# Patient Record
Sex: Female | Born: 1995 | Hispanic: Yes | Marital: Single | Smoking: Never smoker
Health system: Southern US, Community
[De-identification: ages and names within clinical notes are randomized; demographics above are authoritative.]

## PROBLEM LIST (undated history)

## (undated) DIAGNOSIS — Z789 Other specified health status: Secondary | ICD-10-CM

## (undated) HISTORY — PX: NO PAST SURGERIES: SHX2092

---
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
## 2013-01-16 ENCOUNTER — Observation Stay: Payer: Self-pay | Admitting: Obstetrics and Gynecology

## 2013-01-24 ENCOUNTER — Observation Stay: Payer: Self-pay

## 2013-01-24 LAB — CBC WITH DIFFERENTIAL/PLATELET
Basophil %: 0.6 %
Eosinophil #: 0 10*3/uL (ref 0.0–0.7)
HCT: 38 % (ref 35.0–47.0)
HGB: 13 g/dL (ref 12.0–16.0)
Lymphocyte #: 1.8 10*3/uL (ref 1.0–3.6)
Lymphocyte %: 16.2 %
Monocyte %: 8.3 %
Neutrophil #: 8.4 10*3/uL — ABNORMAL HIGH (ref 1.4–6.5)
Neutrophil %: 74.6 %
Platelet: 213 10*3/uL (ref 150–440)
RBC: 4.36 10*6/uL (ref 3.80–5.20)
RDW: 14.3 % (ref 11.5–14.5)
WBC: 11.3 10*3/uL — ABNORMAL HIGH (ref 3.6–11.0)

## 2013-01-24 LAB — URINALYSIS, COMPLETE
Bacteria: NONE SEEN
Glucose,UR: NEGATIVE mg/dL (ref 0–75)
Ketone: NEGATIVE
Nitrite: NEGATIVE
Ph: 7 (ref 4.5–8.0)
Protein: NEGATIVE
RBC,UR: 1 /HPF (ref 0–5)
Specific Gravity: 1.008 (ref 1.003–1.030)
WBC UR: NONE SEEN /HPF (ref 0–5)

## 2013-01-31 ENCOUNTER — Observation Stay: Payer: Self-pay | Admitting: Obstetrics and Gynecology

## 2013-02-01 ENCOUNTER — Inpatient Hospital Stay: Payer: Self-pay | Admitting: Obstetrics & Gynecology

## 2013-02-01 LAB — CBC WITH DIFFERENTIAL/PLATELET
Basophil #: 0.1 10*3/uL (ref 0.0–0.1)
Basophil %: 0.2 %
Eosinophil #: 0 10*3/uL (ref 0.0–0.7)
Eosinophil %: 0 %
HGB: 13.5 g/dL (ref 12.0–16.0)
MCH: 29.2 pg (ref 26.0–34.0)
MCHC: 34.1 g/dL (ref 32.0–36.0)
MCV: 86 fL (ref 80–100)
Monocyte #: 1.1 x10 3/mm — ABNORMAL HIGH (ref 0.2–0.9)
Monocyte %: 4.1 %
Neutrophil %: 91.6 %
RBC: 4.61 10*6/uL (ref 3.80–5.20)

## 2013-02-02 LAB — HEMATOCRIT: HCT: 32.4 % — ABNORMAL LOW (ref 35.0–47.0)

## 2013-12-11 ENCOUNTER — Emergency Department: Payer: Self-pay | Admitting: Emergency Medicine

## 2013-12-11 LAB — URINALYSIS, COMPLETE
BACTERIA: NONE SEEN
BLOOD: NEGATIVE
Bilirubin,UR: NEGATIVE
Glucose,UR: NEGATIVE mg/dL (ref 0–75)
Hyaline Cast: 1
Ketone: NEGATIVE
Leukocyte Esterase: NEGATIVE
Nitrite: NEGATIVE
Ph: 5 (ref 4.5–8.0)
Protein: 30
Specific Gravity: 1.019 (ref 1.003–1.030)
Squamous Epithelial: <1
WBC UR: 3 /HPF (ref 0–5)

## 2013-12-11 LAB — COMPREHENSIVE METABOLIC PANEL
ALBUMIN: 4.2 g/dL (ref 3.4–5.0)
Alkaline Phosphatase: 96 U/L
Anion Gap: 3 — ABNORMAL LOW (ref 7–16)
BILIRUBIN TOTAL: 0.4 mg/dL (ref 0.2–1.0)
BUN: 9 mg/dL (ref 7–18)
CALCIUM: 8.5 mg/dL (ref 8.5–10.1)
CREATININE: 0.52 mg/dL — AB (ref 0.60–1.30)
Chloride: 106 mmol/L (ref 98–107)
Co2: 27 mmol/L (ref 21–32)
EGFR (Non-African Amer.): >60
GLUCOSE: 98 mg/dL (ref 65–99)
Osmolality: 271 (ref 275–301)
Potassium: 3.7 mmol/L (ref 3.5–5.1)
SGOT(AST): 18 U/L (ref 15–37)
SGPT (ALT): 23 U/L
Sodium: 136 mmol/L (ref 136–145)
TOTAL PROTEIN: 7.7 g/dL (ref 6.4–8.2)

## 2013-12-11 LAB — TROPONIN I: Troponin-I: <0.02 ng/mL

## 2013-12-11 LAB — CBC WITH DIFFERENTIAL/PLATELET
BASOS ABS: 0 10*3/uL (ref 0.0–0.1)
Basophil %: 0.5 %
EOS PCT: 0.7 %
Eosinophil #: 0.1 10*3/uL (ref 0.0–0.7)
HCT: 40.5 % (ref 35.0–47.0)
HGB: 13.5 g/dL (ref 12.0–16.0)
LYMPHS PCT: 18.4 %
Lymphocyte #: 1.9 10*3/uL (ref 1.0–3.6)
MCH: 29.6 pg (ref 26.0–34.0)
MCHC: 33.3 g/dL (ref 32.0–36.0)
MCV: 89 fL (ref 80–100)
MONOS PCT: 5 %
Monocyte #: 0.5 x10 3/mm (ref 0.2–0.9)
NEUTROS PCT: 75.4 %
Neutrophil #: 7.6 10*3/uL — ABNORMAL HIGH (ref 1.4–6.5)
PLATELETS: 221 10*3/uL (ref 150–440)
RBC: 4.57 10*6/uL (ref 3.80–5.20)
RDW: 13.2 % (ref 11.5–14.5)
WBC: 10.1 10*3/uL (ref 3.6–11.0)

## 2013-12-11 LAB — LIPASE, BLOOD: Lipase: 180 U/L (ref 73–393)

## 2014-08-19 NOTE — H&P (Signed)
L&D Evaluation:  History Expanded:  HPI 19 yo Hispanic female G1P0 at 6863w1d LMP derived EDC of 02/28/13 presenting with contractions over last few hours. was seen lastr night for possible labor and sent home with no change. now she is 6 cm. and ctx q 3-5 mion. GBS unknown   No LOF, no VB, +FM tdap 12/05/12 flu 01/02/13  PNC at [facility] begun in first trimester and has been otherwise uncomplicated. Received tdap 12/05/12. LABS: A POS, Rubella and varicella vaccines UTD.   Gravida 1   Term 0   PreTerm 0   Abortion 0   Living 0   Blood Type (Maternal) A positive   Group B Strep Results Maternal (Result >5wks must be treated as unknown) unknown/result > 5 weeks ago   Maternal HIV Negative   Maternal Syphilis Ab Nonreactive   Maternal Varicella Immune   Rubella Results (Maternal) immune   Maternal T-Dap Immune   EDC 28-Feb-2013   Presents with contractions, leaking fluid   Patient's Medical History No Chronic Illness   Patient's Surgical History none  (tooth extraction 07/2011)   Medications Pre Natal Vitamins   Allergies NKDA   Social History none   Family History Non-Contributory   ROS:  ROS see HPI   Exam:  Vital Signs stable   Urine Protein negative dipstick   General no apparent distress   Mental Status clear   Chest clear   Heart normal sinus rhythm, no murmur/gallop/rubs   Abdomen gravid, tender with contractions   Estimated Fetal Weight Average for gestational age   Fetal Position cephalic   Edema no edema   Reflexes 1+   Pelvic no external lesions, 6/c/2-   Mebranes Intact   FHT normal rate with no decels   FHT Description Cat1   Ucx regular   Ucx Frequency 4 min   Length of each Contraction 60 seconds   Skin dry   Lymph no lymphadenopathy   Impression:  Impression active labor   Plan:  Plan EFM/NST, admit for ptl GBS unknown status   Comments admit   Follow Up Appointment need to schedule   Electronic  Signatures: Adria DevonKlett, Taresa Montville (MD)  (Signed 24-Oct-14 21:01)  Authored: L&D Evaluation   Last Updated: 24-Oct-14 21:01 by Adria DevonKlett, Milla Wahlberg (MD)

## 2014-08-19 NOTE — H&P (Signed)
L&D Evaluation:  History Expanded:  HPI 19 yo G1P0 who presensts to HD today for pain and cramping since mon in the lower abd region., she was seen at HD and they checked her before doing a FFN. she is 4728w6d, urine shows micro of 10-15 wbc and 0-5 epi. so clean catch with 2+ proteinuria. she is having ctx th were a 7 now are a 5. got tdap 12/05/12   Gravida 1   Term 0   PreTerm 0   Abortion 0   Living 0   Blood Type (Maternal) A positive   Group B Strep Results Maternal (Result >5wks must be treated as unknown) unknown/result > 5 weeks ago   Maternal HIV Negative   Maternal Syphilis Ab Nonreactive   Maternal Varicella Immune   Rubella Results (Maternal) immune   [facility] Immun[facility]   Presents with abdominal pain, contractions   Patient's Medical History No Chronic Illness   Patient's Surgical History none   Medications Pre Natal Vitamins   Allergies NKDA   Social History none   Family History Non-Contributory   ROS:  ROS All systems were reviewed.  HEENT, CNS, GI, GU, Respiratory, CV, Renal and Musculoskeletal systems were found to be normal.   Exam:  Vital Signs stable   Urine Protein 2+   General no apparent distress   Mental Status clear   Chest clear   Heart normal sinus rhythm   Abdomen gravid, tender with contractions   Estimated Fetal Weight Small for gestational age   Edema no edema   Pelvic no external lesions, cervix FT and unchanged in the rtwo hoours she has been here.   Mebranes Intact   FHT normal rate with no decels   Ucx irregular   Skin dry   Impression:  Impression UTI   Plan:  Plan monitor contractions and for cervical change   Comments start macrobid   Follow Up Appointment need to schedule. in 1 week   Electronic Signatures: Adria DevonKlett, Minh Roanhorse (MD)  (Signed 08-Oct-14 16:14)  Authored: L&D Evaluation   Last Updated: 08-Oct-14 16:14 by Adria DevonKlett, Anden Bartolo (MD)

## 2014-08-19 NOTE — H&P (Signed)
L&D Evaluation:  History:  HPI 19 yo Hispanic female G1P0 with EDC=02/28/13 by LMP=05/24/2012 and confirmed with 21 week ultrasound, who presents to L&D at [redacted] weeks gestation with c/o onset regular contractions at 12 midnight and LOF since 0100. Denies VB, Rates contraction pain 4/10. Baby active. Recently seen in L&D 10/8 for cramping/contractions and proteinuria. Was treated for a UTI with Macrobid.  PNC at [facility] begun in first trimester and has been otherwise uncomplicated. Received tdap 12/05/12. LABS: A POS, Rubella and varicella vaccines UTD.   Presents with contractions, leaking fluid   Patient's Medical History No Chronic Illness   Patient's Surgical History none  (tooth extraction 07/2011)   Medications Pre Natal Vitamins   Allergies NKDA   Social History none   Family History Non-Contributory   ROS:  ROS see HPI   Exam:  Vital Signs stable   Urine Protein negative dipstick   General no apparent distress   Mental Status clear   Chest clear   Heart normal sinus rhythm, no murmur/gallop/rubs   Abdomen gravid, tender with contractions   Estimated Fetal Weight 5 1/2#   Fetal Position cephalic   Edema no edema   Reflexes 1+   Pelvic no external lesions, SSE: positive Nitrazine, neg pooling, neg fern. positive ROM PLUS.  +hyphae on fern slide CX:1/75-80%/0 blood on glove after exam   Mebranes Ruptured   Description blood tinged   FHT normal rate with no decels, 135-140 with accels to 160s to 170   FHT Description Cat1   Ucx regular, q2-5 min apart, palpate mild-mod   Skin dry   Impression:  Impression IUP at 35v weeks with PPROM in early labor   Plan:  Plan EFM/NST, monitor contractions and for cervical change, antibiotics for GBBS prophylaxis, Pitocin augmentation if needed. Discussed with patient and FOB risks of prematurity vs risks of nfection with continued observation.  Baby may have respiratory difficulties, hypoglycemia, hypothermia, feeding  difficulties, jaundice associated with prematurity and may need to remain in hospital after mother discharged.   Electronic Signatures: Farrel ConnersGutierrez, Jelisa Faulkton L (CNM)  (Signed 16-Oct-14 05:00)  Authored: L&D Evaluation   Last Updated: 16-Oct-14 05:00 by Trinna BalloonGutierrez, Lon Klippel L (CNM)

## 2014-08-19 NOTE — H&P (Signed)
L&D Evaluation:  History:  HPI 19 yo Hispanic female G1P0 at 1052w0d LMP derived EDC of 02/28/13 presenting with contractions since 10 AM today.  No LOF, no VB, +FM   PNC at [facility] begun in first trimester and has been otherwise uncomplicated. Received tdap 12/05/12. LABS: A POS, Rubella and varicella vaccines UTD.   Presents with contractions, leaking fluid   Patient's Medical History No Chronic Illness   Patient's Surgical History none  (tooth extraction 07/2011)   Medications Pre Natal Vitamins   Allergies NKDA   Social History none   Family History Non-Contributory   ROS:  ROS see HPI   Exam:  Vital Signs stable   Urine Protein negative dipstick   General no apparent distress   Mental Status clear   Chest clear   Heart normal sinus rhythm, no murmur/gallop/rubs   Abdomen gravid, tender with contractions   Estimated Fetal Weight Average for gestational age   Fetal Position cephalic   Edema no edema   Reflexes 1+   Pelvic no external lesions, 4/C/-2   Mebranes Intact   FHT normal rate with no decels   FHT Description Cat1   Ucx irregular   Skin dry   Impression:  Impression IUP at [redacted]w[redacted]d weeks with contraction   Plan:  Plan EFM/NST, monitor contractions and for cervical change   Comments 1) R/O PTL - patient has been effaced for the last week.  Was 3cm in L&D last week and 4cm earlier in clinic this week.  No LOF, no VB.     - recheck in 1-2hrs to see if any change  2) Fetus - category I tracing  3) Disposition - pending cervical change vs stable cervix on recheck if home rx Chief of Staffambien   Electronic Signatures for Addendum Section:  Lorrene ReidStaebler, Keyonta Barradas M (MD) (Signed Addendum 24-Oct-14 01:00)  Unchanged over 2-hrs contractions have spaced out, follow up on 02/04/13   Electronic Signatures: Lorrene ReidStaebler, Sherrel Ploch M (MD)  (Signed 24-Oct-14 01:04)  Authored: L&D Evaluation   Last Updated: 24-Oct-14 01:04 by Lorrene ReidStaebler, Clarence Dunsmore M (MD)

## 2015-05-01 LAB — HM HIV SCREENING LAB: HM HIV SCREENING: NEGATIVE

## 2016-05-01 ENCOUNTER — Encounter: Payer: Self-pay | Admitting: Emergency Medicine

## 2016-05-01 DIAGNOSIS — K1379 Other lesions of oral mucosa: Secondary | ICD-10-CM | POA: Insufficient documentation

## 2016-05-01 DIAGNOSIS — B349 Viral infection, unspecified: Secondary | ICD-10-CM | POA: Insufficient documentation

## 2016-05-01 NOTE — ED Triage Notes (Signed)
Patient with blisters to lower lip and inside mouth that started yesterday.

## 2016-05-02 ENCOUNTER — Emergency Department
Admission: EM | Admit: 2016-05-02 | Discharge: 2016-05-02 | Disposition: A | Discharge status: Home / Self Care | Payer: Self-pay | Attending: Emergency Medicine | Admitting: Emergency Medicine

## 2016-05-02 DIAGNOSIS — B349 Viral infection, unspecified: Secondary | ICD-10-CM

## 2016-05-02 DIAGNOSIS — R112 Nausea with vomiting, unspecified: Secondary | ICD-10-CM

## 2016-05-02 DIAGNOSIS — K121 Other forms of stomatitis: Secondary | ICD-10-CM

## 2016-05-02 MED ORDER — ONDANSETRON 4 MG PO TBDP
4.0000 mg | ORAL_TABLET | Freq: Once | ORAL | Status: AC
  Administered 2016-05-02: 4 mg via ORAL
  Filled 2016-05-02: qty 1

## 2016-05-02 MED ORDER — MAGIC MOUTHWASH W/LIDOCAINE: 5.0000 mL | Freq: Three times a day (TID) | ORAL | 0 refills | Status: DC | PRN

## 2016-05-02 MED ORDER — ONDANSETRON 4 MG PO TBDP: ORAL_TABLET | ORAL | 0 refills | Status: DC

## 2016-05-02 NOTE — ED Notes (Signed)
Pt with bilsters noted to mid lower lip that are yellow like. Pt with lesion noted to left lateral tongue noted. Pt states she has had emesis and "my bones ache". Moist oral mucus membranes.

## 2016-05-02 NOTE — ED Provider Notes (Signed)
[facility] Emergency Department Provider Note  ____________________________________________   First MD Initiated Contact with Patient 05/02/16 0158     (approximate)  I have reviewed the triage vital signs and the nursing notes.   HISTORY  Chief Complaint Mouth Lesions    HPI Nicole Eaton is a 21 y.o. female who denies any chronic medical history presents for evaluation of painful lesions on her tongue and her lower lip.  She states that these started yesterday at the same time she started having body aches and she feels like "my bones hurt", nasal congestion, fever, and general malaise.  She states that she started having some vomiting today and feels some persistent nausea.  She denies chest pain, shortness of breath, abdominal pain, dysuria.  Nothing makes her symptoms better and nothing makes them worse.  She describes the symptoms as severe.  She is awake, alert, oriented, and ambulatory without difficulty.   History reviewed. No pertinent past medical history.  There are no active problems to display for this patient.   History reviewed. No pertinent surgical history.  Prior to Admission medications   Medication Sig Start Date End Date Taking? Authorizing Provider  magic mouthwash w/lidocaine SOLN Take 5 mLs by mouth 3 (three) times daily as needed for mouth pain. Swish around in your mouth and spit it out. 05/02/16   Loleta Rose, MD  ondansetron (ZOFRAN ODT) 4 MG disintegrating tablet Allow 1-2 tablets to dissolve in your mouth every 8 hours as needed for nausea/vomiting 05/02/16   Loleta Rose, MD    Allergies Patient has no known allergies.  No family history on file.  Social History Social History  Substance Use Topics  . Smoking status: Never Smoker  . Smokeless tobacco: Never Used  . Alcohol use Not on file    Review of Systems Constitutional: +fever/chill, analysis, and general malaise Eyes: No visual  changes. ENT: Mild sore throat for several painful ulcerated lesions on her lower lip and her tongue. Cardiovascular: Denies chest pain. Respiratory: Denies shortness of breath. Gastrointestinal: No abdominal pain.  +N/V.  No diarrhea.  No constipation. Genitourinary: Negative for dysuria. Musculoskeletal: Negative for back pain. Skin: Negative for rash. Neurological: Negative for headaches, focal weakness or numbness.  10-point ROS otherwise negative.  ____________________________________________   PHYSICAL EXAM:  VITAL SIGNS: ED Triage Vitals  Enc Vitals Group     BP 05/01/16 2205 135/80     Pulse Rate 05/01/16 2205 (!) 102     Resp 05/01/16 2205 18     Temp 05/01/16 2205 99.2 F (37.3 C)     Temp src --      SpO2 --      Weight 05/01/16 2205 150 lb (68 kg)     Height 05/01/16 2205 5\' 2"  (1.575 m)     Head Circumference --      Peak Flow --      Pain Score 05/01/16 2206 9     Pain Loc --      Pain Edu? --      Excl. in GC? --     Constitutional: Alert and oriented. Generally well-appearing but seems to be suffering from viral symptoms Eyes: Conjunctivae are normal. PERRL. EOMI. Head: Atraumatic. Nose: +congestion/rhinnorhea. Mouth/Throat: Mucous membranes are moist.  Oropharynx mildly erythematous.  She has 2 aphthous ulcers on her lower lobe and one on the side of her tongue.  Otherwise there are no lesions in her oropharynx including no petechiae on the back of  the throat and no exudate.  Her lips are dry. Neck: No stridor.  No meningeal signs.   Cardiovascular: Borderline tachycardia, regular rhythm. Good peripheral circulation. Grossly normal heart sounds. Respiratory: Normal respiratory effort.  No retractions. Lungs CTAB. Gastrointestinal: Soft and nontender. No distention.  Musculoskeletal: No lower extremity tenderness nor edema. No gross deformities of extremities. Neurologic:  Normal speech and language. No gross focal neurologic deficits are appreciated.   Skin:  Skin is warm, dry and intact. No rash noted. Psychiatric: Mood and affect are normal. Speech and behavior are normal.  ____________________________________________   LABS (all labs ordered are listed, but only abnormal results are displayed)  Labs Reviewed - No data to display ____________________________________________  EKG  None - EKG not ordered by ED physician ____________________________________________  RADIOLOGY   No results found.  ____________________________________________   PROCEDURES  Procedure(s) performed:   Procedures   Critical Care performed: No ____________________________________________   INITIAL IMPRESSION / ASSESSMENT AND PLAN / ED COURSE  Pertinent labs & imaging results that were available during my care of the patient were reviewed by me and considered in my medical decision making (see chart for details).  The patient has essentially normal vital signs.  The heart rate was borderline tachycardic at triage after she walked in but it is normal at rest.  She has been stable throughout nearly 5 hours wait in the waiting room.  I discussed the viral symptoms and recommended plan of care which included Zofran, by mouth fluids, etc.  I discussed testing for influenza but explained that this would not change the plan of care and given that she is stable I do not think it is necessary.  She agrees with the plan.  We will give her Zofran and some fluid to make sure that she is able to tolerate oral intake and I will discharge her with Zofran ODT, Magic mouthwash prescription, and my usual and customary return precautions.   Clinical Course as of May 02 242  Mon May 02, 2016  0239 PAtient tolerated PO intake without difficulty.  States she is ready to go home.   [CF]  0239 I gave my usual and customary return precautions.   [CF]    Clinical Course User Index [CF] Loleta Roseory Shaylynn Nulty, MD    ____________________________________________  FINAL  CLINICAL IMPRESSION(S) / ED DIAGNOSES  Final diagnoses:  Mouth ulcers  Acute viral syndrome  Nausea and vomiting, intractability of vomiting not specified, unspecified vomiting type     MEDICATIONS GIVEN DURING THIS VISIT:  Medications  ondansetron (ZOFRAN-ODT) disintegrating tablet 4 mg (4 mg Oral Given 05/02/16 0212)     NEW OUTPATIENT MEDICATIONS STARTED DURING THIS VISIT:  New Prescriptions   MAGIC MOUTHWASH W/LIDOCAINE SOLN    Take 5 mLs by mouth 3 (three) times daily as needed for mouth pain. Swish around in your mouth and spit it out.   ONDANSETRON (ZOFRAN ODT) 4 MG DISINTEGRATING TABLET    Allow 1-2 tablets to dissolve in your mouth every 8 hours as needed for nausea/vomiting    Modified Medications   No medications on file    Discontinued Medications   No medications on file     Note:  This document was prepared using Dragon voice recognition software and may include unintentional dictation errors.    Loleta Roseory Kathee Tumlin, MD 05/02/16 [id number]

## 2016-05-02 NOTE — Discharge Instructions (Signed)

## 2018-04-23 LAB — HM PAP SMEAR: HM Pap smear: NEGATIVE

## 2019-04-12 NOTE — L&D Delivery Note (Signed)
Delivery Note At 5:00 AM a viable female was delivered via Vaginal, Spontaneous (Presentation: Right Occiput Anterior).  APGAR:8/9 , ; weight  .   Placenta status:intact  Spontaneous, Intact.  Cord:loose nuchal 3v   with the following complications: none .  Delayed cord clamping  Mother pushed 2 set and delivered small Female over intact perineum  Anesthesia: None Episiotomy: None Lacerations: None Suture Repair: n/a Est. Blood Loss (mL):  100 cc  Mom to postpartum.  Baby to Couplet care / Skin to Skin.  Ihor Austin Parrie Rasco 12/12/2019, 5:08 AM

## 2019-06-04 ENCOUNTER — Other Ambulatory Visit: Payer: Self-pay | Admitting: Family Medicine

## 2019-06-04 DIAGNOSIS — Z3481 Encounter for supervision of other normal pregnancy, first trimester: Secondary | ICD-10-CM

## 2019-06-10 ENCOUNTER — Other Ambulatory Visit: Payer: Self-pay

## 2019-06-10 ENCOUNTER — Other Ambulatory Visit: Payer: Self-pay | Admitting: Family Medicine

## 2019-06-10 ENCOUNTER — Ambulatory Visit
Admission: RE | Admit: 2019-06-10 | Discharge: 2019-06-10 | Disposition: A | Discharge status: Home / Self Care | Payer: BC Managed Care – PPO | Source: Ambulatory Visit | Attending: Family Medicine | Admitting: Family Medicine

## 2019-06-10 DIAGNOSIS — Z3481 Encounter for supervision of other normal pregnancy, first trimester: Secondary | ICD-10-CM

## 2019-07-31 ENCOUNTER — Other Ambulatory Visit: Payer: Self-pay | Admitting: Family Medicine

## 2019-07-31 DIAGNOSIS — Z3482 Encounter for supervision of other normal pregnancy, second trimester: Secondary | ICD-10-CM

## 2019-08-05 ENCOUNTER — Ambulatory Visit
Admission: RE | Admit: 2019-08-05 | Discharge: 2019-08-05 | Disposition: A | Discharge status: Home / Self Care | Payer: BC Managed Care – PPO | Source: Ambulatory Visit | Attending: Family Medicine | Admitting: Family Medicine

## 2019-08-05 ENCOUNTER — Other Ambulatory Visit: Payer: Self-pay

## 2019-08-05 DIAGNOSIS — Z3482 Encounter for supervision of other normal pregnancy, second trimester: Secondary | ICD-10-CM | POA: Diagnosis not present

## 2019-11-06 ENCOUNTER — Other Ambulatory Visit: Payer: Self-pay

## 2019-11-06 ENCOUNTER — Observation Stay
Admission: EM | Admit: 2019-11-06 | Discharge: 2019-11-06 | Disposition: A | Discharge status: Home / Self Care | Payer: BC Managed Care – PPO | Attending: Obstetrics and Gynecology | Admitting: Obstetrics and Gynecology

## 2019-11-06 DIAGNOSIS — Z8349 Family history of other endocrine, nutritional and metabolic diseases: Secondary | ICD-10-CM | POA: Insufficient documentation

## 2019-11-06 DIAGNOSIS — Z79899 Other long term (current) drug therapy: Secondary | ICD-10-CM | POA: Insufficient documentation

## 2019-11-06 DIAGNOSIS — Z3A36 36 weeks gestation of pregnancy: Secondary | ICD-10-CM | POA: Insufficient documentation

## 2019-11-06 DIAGNOSIS — Z841 Family history of disorders of kidney and ureter: Secondary | ICD-10-CM | POA: Insufficient documentation

## 2019-11-06 DIAGNOSIS — O26893 Other specified pregnancy related conditions, third trimester: Principal | ICD-10-CM | POA: Insufficient documentation

## 2019-11-06 DIAGNOSIS — Z8249 Family history of ischemic heart disease and other diseases of the circulatory system: Secondary | ICD-10-CM | POA: Insufficient documentation

## 2019-11-06 DIAGNOSIS — Z8489 Family history of other specified conditions: Secondary | ICD-10-CM | POA: Insufficient documentation

## 2019-11-06 DIAGNOSIS — Z820 Family history of epilepsy and other diseases of the nervous system: Secondary | ICD-10-CM | POA: Insufficient documentation

## 2019-11-06 DIAGNOSIS — R109 Unspecified abdominal pain: Secondary | ICD-10-CM | POA: Insufficient documentation

## 2019-11-06 LAB — URINALYSIS, COMPLETE (UACMP) WITH MICROSCOPIC
Bacteria, UA: NONE SEEN
Bilirubin Urine: NEGATIVE
Glucose, UA: NEGATIVE mg/dL
Hgb urine dipstick: NEGATIVE
Ketones, ur: NEGATIVE mg/dL
Leukocytes,Ua: NEGATIVE
Nitrite: NEGATIVE
Protein, ur: NEGATIVE mg/dL
Specific Gravity, Urine: 1.021 (ref 1.005–1.030)
pH: 6 (ref 5.0–8.0)

## 2019-11-06 LAB — WET PREP, GENITAL
Sperm: NONE SEEN
Trich, Wet Prep: NONE SEEN
Yeast Wet Prep HPF POC: NONE SEEN

## 2019-11-06 LAB — CHLAMYDIA/NGC RT PCR (ARMC ONLY)
Chlamydia Tr: NOT DETECTED
N gonorrhoeae: NOT DETECTED

## 2019-11-06 MED ORDER — METRONIDAZOLE 500 MG PO TABS: 500.0000 mg | ORAL_TABLET | Freq: Two times a day (BID) | ORAL | 0 refills | Status: AC

## 2019-11-06 NOTE — Discharge Summary (Signed)
RN reviewed discharge instructions with patient. Gave patient opportunity for questions. All questions answered at this time. Pt verbalized understanding. Pt discharged home. 

## 2019-11-06 NOTE — Discharge Summary (Signed)
Nicole Eaton is a 24 y.o. female. She is at [redacted]w[redacted]d gestation. Patient's last menstrual period was 03/19/2019 (approximate). Estimated Date of Delivery: 12/24/19  Prenatal care site:  [facility]- no prenatal records available.   Current pregnancy complicated by:  1. Previous preterm birth at 72wks, receiving Makena injections, next one scheduled this Friday on 7/30   Chief complaint: c/o vaginal pressure at night and pain in her lower abdomen since Sunday night Duration: constant Quality: cramping and pressure Severity:  Aggravating or alleviating conditions: has not tried any remedies Associated signs/symptoms: none Context: n/a  S: Resting comfortably. no CTX, no VB.no LOF,  Active fetal movement. Denies: HA, visual changes, SOB, or RUQ/epigastric pain  Maternal Medical History:  History reviewed. No pertinent past medical history.  History reviewed. No pertinent surgical history.  No Known Allergies  Prior to Admission medications   Medication Sig Start Date End Date Taking? Authorizing Provider  magic mouthwash w/lidocaine SOLN Take 5 mLs by mouth 3 (three) times daily as needed for mouth pain. Swish around in your mouth and spit it out. Patient not taking: Reported on 11/06/2019 05/02/16   Loleta Rose, MD  norgestimate-ethinyl estradiol (ORTHO-CYCLEN,SPRINTEC,PREVIFEM) 0.25-35 MG-MCG tablet Take 1 tablet by mouth daily. 04/23/18 04/24/19  Tessa Lerner, NP  ondansetron (ZOFRAN ODT) 4 MG disintegrating tablet Allow 1-2 tablets to dissolve in your mouth every 8 hours as needed for nausea/vomiting Patient not taking: Reported on 11/06/2019 05/02/16   Loleta Rose, MD      Social History: She  reports that she has never smoked. She has never used smokeless tobacco. She reports that she does not drink alcohol and does not use drugs.  Family History: family history includes Anemia in her brother and sister; High Cholesterol in her father; Hypertension in her  father; Kidney disease in her paternal grandfather; Lupus in her mother; Migraines in her sister.   Review of Systems: A full review of systems was performed and negative except as noted in the HPI.     O:  BP 100/82   Pulse 93   Temp 98.1 F (36.7 C) (Oral)   Resp 18   Ht 5\' 3"  (1.6 m)   Wt 70.3 kg   LMP 03/19/2019 (Approximate)   BMI 27.46 kg/m  Results for orders placed or performed during the hospital encounter of 11/06/19 (from the past 48 hour(s))  Wet prep, genital   Collection Time: 11/06/19  2:25 PM  Result Value Ref Range   Yeast Wet Prep HPF POC NONE SEEN NONE SEEN   Trich, Wet Prep NONE SEEN NONE SEEN   Clue Cells Wet Prep HPF POC PRESENT (A) NONE SEEN   WBC, Wet Prep HPF POC FEW (A) NONE SEEN   Sperm NONE SEEN   Urinalysis, Complete w Microscopic   Collection Time: 11/06/19  2:25 PM  Result Value Ref Range   Color, Urine YELLOW (A) YELLOW   APPearance HAZY (A) CLEAR   Specific Gravity, Urine 1.021 1.005 - 1.030   pH 6.0 5.0 - 8.0   Glucose, UA NEGATIVE NEGATIVE mg/dL   Hgb urine dipstick NEGATIVE NEGATIVE   Bilirubin Urine NEGATIVE NEGATIVE   Ketones, ur NEGATIVE NEGATIVE mg/dL   Protein, ur NEGATIVE NEGATIVE mg/dL   Nitrite NEGATIVE NEGATIVE   Leukocytes,Ua NEGATIVE NEGATIVE   RBC / HPF 0-5 0 - 5 RBC/hpf   WBC, UA 0-5 0 - 5 WBC/hpf   Bacteria, UA NONE SEEN NONE SEEN   Squamous Epithelial / LPF 6-10 0 -  5   Mucus PRESENT      Constitutional: NAD, AAOx3  HE/ENT: extraocular movements grossly intact, moist mucous membranes CV: RRR PULM: nl respiratory effort, CTABL     Abd: gravid, non-tender, non-distended, soft      Ext: Non-tender, Nonedematous   Psych: mood appropriate, speech normal Pelvic: SSE done: copious amount of frothy yellow/white dc noted. Cervix visually closed and long. No VB.   Fetal  monitoring: Cat I Appropriate for GA Baseline: 130 bpm Variability: moderate Accelerations: present x >2 Decelerations absent  Toco: 1 UC with mild  UI noted.     A/P: 24 y.o. [redacted]w[redacted]d here for antenatal surveillance for vaginal pressure and abdominal cramping in 3rd trimester.   Principle Diagnosis:  vaginal pressure and abdominal cramping in 3rd trimester.    Preterm labor: not present.   Fetal Wellbeing: Reassuring Cat 1 tracing with reactive NST   GC/CT pending, UA negative   wet prep c/w BV, Rx Flagyl to pharmacy  D/c home stable, precautions reviewed, follow-up as scheduled.    Randa Ngo, CNM 11/06/2019  4:26 PM

## 2019-11-06 NOTE — OB Triage Note (Signed)
Pt presents c/o vaginal pressure at night and pain in her lower abdomen since Sunday night. Last intercourse was Friday. Pt denies bleeding or LOF. Pt denies any urinary symptoms. Reports positive fetal movement. VSS. Will continue to monitor.

## 2019-12-11 ENCOUNTER — Encounter: Payer: Self-pay | Admitting: Obstetrics and Gynecology

## 2019-12-11 ENCOUNTER — Inpatient Hospital Stay
Admission: EM | Admit: 2019-12-11 | Discharge: 2019-12-13 | DRG: 807 | Disposition: A | Discharge status: Home / Self Care | Payer: Medicaid Other | Attending: Obstetrics and Gynecology | Admitting: Obstetrics and Gynecology

## 2019-12-11 ENCOUNTER — Other Ambulatory Visit: Payer: Self-pay

## 2019-12-11 DIAGNOSIS — Z20822 Contact with and (suspected) exposure to covid-19: Secondary | ICD-10-CM | POA: Diagnosis present

## 2019-12-11 DIAGNOSIS — O1404 Mild to moderate pre-eclampsia, complicating childbirth: Principal | ICD-10-CM | POA: Diagnosis present

## 2019-12-11 DIAGNOSIS — O479 False labor, unspecified: Secondary | ICD-10-CM | POA: Diagnosis present

## 2019-12-11 DIAGNOSIS — Z3A38 38 weeks gestation of pregnancy: Secondary | ICD-10-CM

## 2019-12-11 HISTORY — DX: Other specified health status: Z78.9

## 2019-12-11 NOTE — OB Triage Note (Signed)
Pt is a 24y/o G2P1 at [redacted]w[redacted]d with c/o contractions that began around 7pm and are every 5-7 min rating 10/10. Pt states +FM. Pt denies LOF. Pt states she has had some blood tinged mucous discharge.  Monitors applied and assessing. Initial FHT 140.

## 2019-12-12 ENCOUNTER — Encounter: Payer: Self-pay | Admitting: Obstetrics and Gynecology

## 2019-12-12 DIAGNOSIS — O1404 Mild to moderate pre-eclampsia, complicating childbirth: Secondary | ICD-10-CM | POA: Diagnosis present

## 2019-12-12 DIAGNOSIS — O479 False labor, unspecified: Secondary | ICD-10-CM | POA: Diagnosis present

## 2019-12-12 DIAGNOSIS — Z20822 Contact with and (suspected) exposure to covid-19: Secondary | ICD-10-CM | POA: Diagnosis present

## 2019-12-12 DIAGNOSIS — Z3A38 38 weeks gestation of pregnancy: Secondary | ICD-10-CM | POA: Diagnosis not present

## 2019-12-12 LAB — CBC
HCT: 33.9 % — ABNORMAL LOW (ref 36.0–46.0)
Hemoglobin: 11.3 g/dL — ABNORMAL LOW (ref 12.0–15.0)
MCH: 27.8 pg (ref 26.0–34.0)
MCHC: 33.3 g/dL (ref 30.0–36.0)
MCV: 83.3 fL (ref 80.0–100.0)
Platelets: 216 10*3/uL (ref 150–400)
RBC: 4.07 MIL/uL (ref 3.87–5.11)
RDW: 15.4 % (ref 11.5–15.5)
WBC: 13.4 10*3/uL — ABNORMAL HIGH (ref 4.0–10.5)
nRBC: 0 % (ref 0.0–0.2)

## 2019-12-12 LAB — GROUP B STREP BY PCR: Group B strep by PCR: NEGATIVE

## 2019-12-12 LAB — PROTEIN / CREATININE RATIO, URINE
Creatinine, Urine: 105 mg/dL
Protein Creatinine Ratio: 0.31 mg/mg{Cre} — ABNORMAL HIGH (ref 0.00–0.15)
Total Protein, Urine: 33 mg/dL

## 2019-12-12 LAB — COMPREHENSIVE METABOLIC PANEL
ALT: 11 U/L (ref 0–44)
AST: 20 U/L (ref 15–41)
Albumin: 3 g/dL — ABNORMAL LOW (ref 3.5–5.0)
Alkaline Phosphatase: 183 U/L — ABNORMAL HIGH (ref 38–126)
Anion gap: 11 (ref 5–15)
BUN: 15 mg/dL (ref 6–20)
CO2: 20 mmol/L — ABNORMAL LOW (ref 22–32)
Calcium: 8.6 mg/dL — ABNORMAL LOW (ref 8.9–10.3)
Chloride: 103 mmol/L (ref 98–111)
Creatinine, Ser: 0.64 mg/dL (ref 0.44–1.00)
GFR calc Af Amer: >60 mL/min (ref >60)
GFR calc non Af Amer: >60 mL/min (ref >60)
Glucose, Bld: 89 mg/dL (ref 70–99)
Potassium: 4.1 mmol/L (ref 3.5–5.1)
Sodium: 134 mmol/L — ABNORMAL LOW (ref 135–145)
Total Bilirubin: 0.6 mg/dL (ref 0.3–1.2)
Total Protein: 6.8 g/dL (ref 6.5–8.1)

## 2019-12-12 LAB — SARS CORONAVIRUS 2 BY RT PCR (HOSPITAL ORDER, PERFORMED IN ~~LOC~~ HOSPITAL LAB): SARS Coronavirus 2: NEGATIVE

## 2019-12-12 LAB — ABO/RH: ABO/RH(D): A POS

## 2019-12-12 MED ORDER — SODIUM CHLORIDE 0.9 % IV SOLN
5.0000 10*6.[IU] | Freq: Once | INTRAVENOUS | Status: DC
  Filled 2019-12-12: qty 5

## 2019-12-12 MED ORDER — OXYCODONE-ACETAMINOPHEN 5-325 MG PO TABS: 1.0000 | ORAL_TABLET | ORAL | Status: DC | PRN

## 2019-12-12 MED ORDER — ZOLPIDEM TARTRATE 5 MG PO TABS: 5.0000 mg | ORAL_TABLET | Freq: Every evening | ORAL | Status: DC | PRN

## 2019-12-12 MED ORDER — LACTATED RINGERS IV SOLN: 500.0000 mL | INTRAVENOUS | Status: DC | PRN

## 2019-12-12 MED ORDER — OXYTOCIN BOLUS FROM INFUSION
333.0000 mL | Freq: Once | INTRAVENOUS | Status: DC
  Administered 2019-12-12: 333 mL via INTRAVENOUS

## 2019-12-12 MED ORDER — DIPHENHYDRAMINE HCL 25 MG PO CAPS: 25.0000 mg | ORAL_CAPSULE | Freq: Four times a day (QID) | ORAL | Status: DC | PRN

## 2019-12-12 MED ORDER — IBUPROFEN 600 MG PO TABS
600.0000 mg | ORAL_TABLET | Freq: Four times a day (QID) | ORAL | Status: DC
  Administered 2019-12-12 – 2019-12-13 (×4): 600 mg via ORAL
  Filled 2019-12-12 (×4): qty 1

## 2019-12-12 MED ORDER — MEASLES, MUMPS & RUBELLA VAC IJ SOLR
0.5000 mL | Freq: Once | INTRAMUSCULAR | Status: DC
  Filled 2019-12-12: qty 0.5

## 2019-12-12 MED ORDER — BENZOCAINE-MENTHOL 20-0.5 % EX AERO: 1.0000 "application " | INHALATION_SPRAY | CUTANEOUS | Status: DC | PRN

## 2019-12-12 MED ORDER — ONDANSETRON HCL 4 MG/2ML IJ SOLN: 4.0000 mg | INTRAMUSCULAR | Status: DC | PRN

## 2019-12-12 MED ORDER — AMMONIA AROMATIC IN INHA
RESPIRATORY_TRACT | Status: AC
  Filled 2019-12-12: qty 10

## 2019-12-12 MED ORDER — MAGNESIUM HYDROXIDE 400 MG/5ML PO SUSP: 30.0000 mL | ORAL | Status: DC | PRN

## 2019-12-12 MED ORDER — BUTORPHANOL TARTRATE 1 MG/ML IJ SOLN: 1.0000 mg | INTRAMUSCULAR | Status: DC | PRN

## 2019-12-12 MED ORDER — ACETAMINOPHEN 325 MG PO TABS: 650.0000 mg | ORAL_TABLET | ORAL | Status: DC | PRN

## 2019-12-12 MED ORDER — SOD CITRATE-CITRIC ACID 500-334 MG/5ML PO SOLN: 30.0000 mL | ORAL | Status: DC | PRN

## 2019-12-12 MED ORDER — SENNOSIDES-DOCUSATE SODIUM 8.6-50 MG PO TABS
2.0000 | ORAL_TABLET | ORAL | Status: DC
  Administered 2019-12-12: 2 via ORAL
  Filled 2019-12-12: qty 2

## 2019-12-12 MED ORDER — COCONUT OIL OIL: 1.0000 "application " | TOPICAL_OIL | Status: DC | PRN

## 2019-12-12 MED ORDER — OXYTOCIN 10 UNIT/ML IJ SOLN
INTRAMUSCULAR | Status: AC
  Filled 2019-12-12: qty 2

## 2019-12-12 MED ORDER — LACTATED RINGERS IV SOLN: INTRAVENOUS | Status: DC

## 2019-12-12 MED ORDER — LIDOCAINE HCL (PF) 1 % IJ SOLN
INTRAMUSCULAR | Status: AC
  Filled 2019-12-12: qty 30

## 2019-12-12 MED ORDER — ONDANSETRON HCL 4 MG/2ML IJ SOLN: 4.0000 mg | Freq: Four times a day (QID) | INTRAMUSCULAR | Status: DC | PRN

## 2019-12-12 MED ORDER — MISOPROSTOL 200 MCG PO TABS
ORAL_TABLET | ORAL | Status: AC
  Filled 2019-12-12: qty 4

## 2019-12-12 MED ORDER — FERROUS SULFATE 325 (65 FE) MG PO TABS
325.0000 mg | ORAL_TABLET | Freq: Two times a day (BID) | ORAL | Status: DC
  Administered 2019-12-12 – 2019-12-13 (×3): 325 mg via ORAL
  Filled 2019-12-12 (×3): qty 1

## 2019-12-12 MED ORDER — BUTORPHANOL TARTRATE 1 MG/ML IJ SOLN
INTRAMUSCULAR | Status: AC
  Administered 2019-12-12: 1 mg via INTRAVENOUS
  Filled 2019-12-12: qty 1

## 2019-12-12 MED ORDER — OXYCODONE-ACETAMINOPHEN 5-325 MG PO TABS: 2.0000 | ORAL_TABLET | ORAL | Status: DC | PRN

## 2019-12-12 MED ORDER — ONDANSETRON HCL 4 MG PO TABS: 4.0000 mg | ORAL_TABLET | ORAL | Status: DC | PRN

## 2019-12-12 MED ORDER — PRENATAL MULTIVITAMIN CH
1.0000 | ORAL_TABLET | Freq: Every day | ORAL | Status: DC
  Administered 2019-12-12: 1 via ORAL
  Filled 2019-12-12: qty 1

## 2019-12-12 MED ORDER — LIDOCAINE HCL (PF) 1 % IJ SOLN: 30.0000 mL | INTRAMUSCULAR | Status: DC | PRN

## 2019-12-12 MED ORDER — SIMETHICONE 80 MG PO CHEW: 80.0000 mg | CHEWABLE_TABLET | ORAL | Status: DC | PRN

## 2019-12-12 MED ORDER — BUTORPHANOL TARTRATE 1 MG/ML IJ SOLN: 1.0000 mg | INTRAMUSCULAR | Status: DC

## 2019-12-12 MED ORDER — PENICILLIN G POT IN DEXTROSE 60000 UNIT/ML IV SOLN: 3.0000 10*6.[IU] | INTRAVENOUS | Status: DC

## 2019-12-12 MED ORDER — OXYTOCIN-SODIUM CHLORIDE 30-0.9 UT/500ML-% IV SOLN
2.5000 [IU]/h | INTRAVENOUS | Status: DC
  Administered 2019-12-12: 2.5 [IU]/h via INTRAVENOUS
  Filled 2019-12-12: qty 500

## 2019-12-12 MED ORDER — WITCH HAZEL-GLYCERIN EX PADS: 1.0000 "application " | MEDICATED_PAD | CUTANEOUS | Status: DC | PRN

## 2019-12-12 MED ORDER — HYDROCODONE-ACETAMINOPHEN 5-325 MG PO TABS: 1.0000 | ORAL_TABLET | ORAL | Status: DC | PRN

## 2019-12-12 MED ORDER — DIBUCAINE (PERIANAL) 1 % EX OINT: 1.0000 "application " | TOPICAL_OINTMENT | CUTANEOUS | Status: DC | PRN

## 2019-12-12 NOTE — H&P (Signed)
Nicole Eaton is a 24 y.o. female presenting for uterine ctx . BP elevated mild range on admission . + proteinuria P/Cr 0.31. Mild h/a for 3 weeks . No vision change . Prior h/o PTD and she has been on mykena. GBS status unknown  She denies any medical problems  OB History    Gravida  2   Para  1   Term      Preterm  1   AB      Living  1     SAB      TAB      Ectopic      Multiple      Live Births             Past Medical History:  Diagnosis Date  . Medical history non-contributory    Past Surgical History:  Procedure Laterality Date  . NO PAST SURGERIES     Family History: family history includes Anemia in her brother and sister; High Cholesterol in her father; Hypertension in her father; Kidney disease in her paternal grandfather; Lupus in her mother; Migraines in her sister. Social History:  reports that she has never smoked. She has never used smokeless tobacco. She reports that she does not drink alcohol and does not use drugs.     Maternal Diabetes: No Genetic Screening: no record Maternal Ultrasounds/Referrals: Normal Fetal Ultrasounds or other Referrals:  None Maternal Substance Abuse:  No Significant Maternal Medications:  None Significant Maternal Lab Results:  Other:  unknown GBSOther Comments:  None  Review of Systems History Dilation: 5 Effacement (%): 90 Station: -3 Exam by:: Nicole Gervase MD  Exam by Nicole Eaton 5 cm / c/-1  Blood pressure (!) 149/97, pulse 72, temperature 98.6 F (37 C), temperature source Oral, resp. rate 16, height 5\' 3"  (1.6 m), weight 84.4 kg, last menstrual period 03/19/2019. Exam Physical Exam  Lungs cta cv RRR FHR: intermittent late decels and deep variable , AROM : fse and IUPC placed . FHR 135 Mod variability and + accels . Mild variable decels( time 0417)   Prenatal labs: ABO, Rh:  A+ Antibody:  neg Rubella:  Imm / Varicella IMM RPR:   NR HBsAg:  neg  HIV:   neg GBS:   unknown    Assessment/Plan: Mild preeclampsia . BP not in severe range . If BP rises to severe of she develops worsening CNS sx I will start MAgnesium for sz prophylaxis . Currently reassuring fetal monitoring. If variable decels persist / worsen add amnioinfusion  Start PCN for unknown GBS status  Stadol IV for pain management. Declines CLE currently 14/11/2018 Nicole Eaton 12/12/2019, 4:07 AM

## 2019-12-12 NOTE — Lactation Note (Signed)
This note was copied from a baby's chart. Lactation Consultation Note  Patient Name: Nicole Eaton [id number] Date: 12/12/2019 Reason for consult: Follow-up assessment;Early term 37-38.6wks;Infant < 6lbs;Other (Comment) (pre-feed BS 38)  1hr post feed glucose was 38, RN notified Neo who instructed for baby to receive 30mL of 24cal formula followed by 1hr post feed repeat glucose.  LC assisted with first supplement feed educating mom on paced bottle feeding, positioning of infant and pauses and burping.  Feeding took place over 20 minutes with consumption of only 76mL. Baby placed skin to skin with mom. Feeding plan overnight: put baby to breast/wake to feed minimum every 3hrs, provide 76mL 24cal formula supplement, and pump post feedings.  Maternal Data Formula Feeding for Exclusion: No Has patient been taught Hand Expression?: Yes Does the patient have breastfeeding experience prior to this delivery?: Yes  Feeding Feeding Type: Bottle Fed - Formula  LATCH Score Latch: Grasps breast easily, tongue down, lips flanged, rhythmical sucking.  Audible Swallowing: A few with stimulation (breast compressions/massage)  Type of Nipple: Everted at rest and after stimulation  Comfort (Breast/Nipple): Soft / non-tender  Hold (Positioning): No assistance needed to correctly position infant at breast.  LATCH Score: 9  Interventions Interventions: Breast feeding basics reviewed;Hand express;Hand pump (Observation of feeding)  Lactation Tools Discussed/Used Tools: Pump;58F feeding tube / Syringe (syringe) Breast pump type: Double-Electric Breast Pump   Consult Status Consult Status: Follow-up Follow-up type: In-patient    Danford Bad 12/12/2019, 4:55 PM

## 2019-12-12 NOTE — Lactation Note (Signed)
This note was copied from a baby's chart. Lactation Consultation Note  Patient Name: Nicole Eaton [id number] Date: 12/12/2019 Reason for consult: Initial assessment;Early term 37-38.6wks;Infant < 6lbs  Initial lactation visit. Baby born at [redacted]w[redacted]d via SVD weight <6lbs. Initial BS was 41, second at 51, needing one more pre-feed before discontinuing BS protocol. Mom reports deep latch achieved on left breast, but needs assistance with left breast. LC did do nipple rolling and hand expression (colostrum easily expressed) to help bring out nipple, after a few attempts baby latched and sustained latch with LC support of breast tissue. With LC support baby did sustain latch and displayed a strong rhythmic sucking pattern with swallows intermittent identifiable by deeper chin movement. Baby remained at the breast and active for 12 minutes, coming off the breast on his own and asleep/content. Encouraged mom to keep baby skin to skin on chest following feed to help also with stabilization of blood sugars. LC assisted with getting mom comfortable, and bringing food to tray.  LC also spoke with mom about beginning a pumping routine to assist with milk removal, building of supply, and use as supplement if needed; mom in agreement.  Updated RN on feeding and plan to initiate pumping.   Maternal Data Formula Feeding for Exclusion: No Has patient been taught Hand Expression?: Yes Does the patient have breastfeeding experience prior to this delivery?: Yes  Feeding Feeding Type: Breast Fed  LATCH Score Latch: Repeated attempts needed to sustain latch, nipple held in mouth throughout feeding, stimulation needed to elicit sucking reflex.  Audible Swallowing: A few with stimulation (with compression)  Type of Nipple: Everted at rest and after stimulation (required nipple rolling)  Comfort (Breast/Nipple): Soft / non-tender  Hold (Positioning): Assistance needed to correctly position infant at  breast and maintain latch.  LATCH Score: 7  Interventions Interventions: Breast feeding basics reviewed;Assisted with latch;Hand express;Skin to skin;Breast massage;Breast compression;Adjust position;Support pillows  Lactation Tools Discussed/Used     Consult Status Consult Status: Follow-up Date: 12/12/19 Follow-up type: In-patient    Danford Bad 12/12/2019, 10:52 AM

## 2019-12-12 NOTE — Discharge Summary (Signed)
Obstetrical Discharge Summary  Patient Name: Nicole Eaton DOB: Oct 27, 1995 MRN: [id number]  Date of Admission: 12/11/2019 Date of Delivery: 12/12/19 Delivered by: Beverly Gust MD Date of Discharge: 12/13/2019  Primary OB: Phineas Real OZD:[id number] last menstrual period was 03/19/2019 (approximate). EDC Estimated Date of Delivery: 12/24/19 Gestational Age at Delivery: [redacted]w[redacted]d   Antepartum complications:elevated BP  Admitting Diagnosis: labor , Mild preeclampsia  Secondary Diagnosis: Patient Active Problem List   Diagnosis Date Noted  . Labor and delivery, indication for care 12/12/2019  . Uterine contractions during pregnancy 12/12/2019  . Abdominal pain during pregnancy, third trimester 11/06/2019    Augmentation: AROM Complications: None  Intrapartum complications/course: Presented to L&D with contractions.  Mild range blood pressures with report of headache noted during triage visit.  Admitted for labor and preeclampsia without severe features.  See delivery note for further details.   Date of Delivery:  Delivered By: Beverly Gust MD Delivery Type: spontaneous vaginal delivery Anesthesia: none Placenta: spontaneous, loose nuchal Laceration: none Episiotomy: none Newborn Data:female APGARS 8/9 weight ; 5lbs 10oz, 2560g   Postpartum Procedures: none   Edinburgh:  Edinburgh Postnatal Depression Scale Screening Tool 12/12/2019  I have been able to laugh and see the funny side of things. 0  I have looked forward with enjoyment to things. 0  I have blamed myself unnecessarily when things went wrong. 0  I have been anxious or worried for no good reason. 0  I have felt scared or panicky for no good reason. 0  Things have been getting on top of me. 0  I have been so unhappy that I have had difficulty sleeping. 0  I have felt sad or miserable. 0  I have been so unhappy that I have been crying. 0  The thought of harming myself has occurred to me. 0  Edinburgh  Postnatal Depression Scale Total 0    Post partum course:  Patient had an uncomplicated postpartum course.  By time of discharge on PPD#1, her pain was controlled on oral pain medications; she had appropriate lochia and was ambulating, voiding without difficulty and tolerating regular diet.  She was deemed stable for discharge to home.    Discharge Physical Exam:  BP 122/90 (BP Location: Right Arm)   Pulse 88   Temp 98.4 F (36.9 C) (Oral)   Resp 18   Ht 5\' 3"  (1.6 m)   Wt 84.4 kg   LMP 03/19/2019 (Approximate)   SpO2 100%   Breastfeeding Unknown   BMI 32.95 kg/m   Temp:  [98.1 F (36.7 C)-98.4 F (36.9 C)] 98.4 F (36.9 C) (09/03 0846) Pulse Rate:  [78-98] 88 (09/03 0846) Resp:  [18-20] 18 (09/03 0846) BP: (115-125)/(68-90) 122/90 (09/03 0846) SpO2:  [99 %-100 %] 100 % (09/03 0846)  General: NAD CV: RRR Pulm: CTABL, nl effort ABD: s/nd/nt, fundus firm and below the umbilicus Lochia: moderate Incision: c/d/i DVT Evaluation: LE non-ttp, no evidence of DVT on exam.  Hemoglobin  Date Value Ref Range Status  12/13/2019 9.0 (L) 12.0 - 15.0 g/dL Final   HGB  Date Value Ref Range Status  12/11/2013 13.5 12.0 - 16.0 g/dL Final   HCT  Date Value Ref Range Status  12/13/2019 26.8 (L) 36 - 46 % Final  12/11/2013 40.5 35.0 - 47.0 % Final     Disposition: stable, discharge to home. Baby Feeding: breastmilk and formula Baby Disposition: home with mom  Rh Immune globulin given: Rh pos  Rubella vaccine given: Immune  Tdap vaccine given  in AP or PP setting:  Flu vaccine given in AP or PP setting:   Contraception: TBD  Prenatal Labs:   ABO, Rh:  A+ Antibody:  neg Rubella:  Imm / Varicella IMM RPR:   NR HBsAg:  neg  HIV:   neg GBS:   negative   Plan:  Aiesha Jahzaria Vary was discharged to home in good condition. Follow-up appointment with delivering provider in 1 week for blood pressure check and  6 weeks for postpartum visit.  Discharge  Medications: Allergies as of 12/13/2019   No Known Allergies     Medication List    TAKE these medications   acetaminophen 325 MG tablet Commonly known as: Tylenol Take 2 tablets (650 mg total) by mouth every 6 (six) hours as needed for mild pain or moderate pain (for pain scale < 4).   ferrous sulfate 325 (65 FE) MG tablet Take 1 tablet (325 mg total) by mouth 2 (two) times daily with a meal.   ibuprofen 600 MG tablet Commonly known as: ADVIL Take 1 tablet (600 mg total) by mouth every 6 (six) hours as needed for mild pain or moderate pain.   prenatal multivitamin Tabs tablet Take 1 tablet by mouth daily at 12 noon.        Follow-up Information    [facility] Follow up in 1 week(s).   Why: blood pressure check Contact information: [address] [place] [address] [phone]       Schermerhorn, Ihor Austin, MD. Schedule an appointment as soon as possible for a visit in 6 week(s).   Specialty: Obstetrics and Gynecology Why: postpartum visit Contact information: [address] [place] [address] [phone]               Signed:  Margaretmary Eddy, CNM Certified Nurse Midwife [facility]

## 2019-12-12 NOTE — Lactation Note (Signed)
This note was copied from a baby's chart. Lactation Consultation Note  Patient Name: Nicole Eaton [id number] Date: 12/12/2019 Reason for consult: Follow-up assessment;Early term 37-38.6wks;Infant < 6lbs;Other (Comment) (pre-feed BS 38)  Lactation follow-up after pre-feed BS was 38. BS protocol indicates 1hr post feed BS test, and 2 consecutive BS test >40.  Baby already at breast when [facility] entered; cradle hold, left breast. Baby had strong rhythmic sucking pattern, audible and visual swallows intermittently. LC pulled down slightly on chin to flange bottom lip, and encouraged mom breast compressions and massage to assist with moving colostrum easier for baby.  Encouraged to keep baby at breast as long as active, tips given to keep baby active, and mom instructed to pump post feeding for additional stimulation and milk removal. Baby fed for 15 minutes, remains alert, but content lying with mom. LC syringe/finger fed 61ml of EBM post feeding at the breast. LC changed stool diaper, and assisted mom with set-up of pump.  Plan will be 1hr post feed glucose test followed by 2 more pre-feed tests. Mom encouraged to be with baby skin to skin as long and as much as possible between feedings, aiming to feed minimum every 3hrs at this time.   Maternal Data Formula Feeding for Exclusion: No Has patient been taught Hand Expression?: Yes Does the patient have breastfeeding experience prior to this delivery?: Yes  Feeding Feeding Type: Breast Fed  LATCH Score Latch: Grasps breast easily, tongue down, lips flanged, rhythmical sucking.  Audible Swallowing: A few with stimulation (breast compressions/massage)  Type of Nipple: Everted at rest and after stimulation  Comfort (Breast/Nipple): Soft / non-tender  Hold (Positioning): No assistance needed to correctly position infant at breast.  LATCH Score: 9  Interventions Interventions: Breast feeding basics reviewed;Hand express;Hand pump  (Observation of feeding)  Lactation Tools Discussed/Used Tools: Pump Breast pump type: Double-Electric Breast Pump Pump Review: Setup, frequency, and cleaning;Milk Storage Initiated by:: Magnus Ivan, MPH, IBCLC Date initiated:: 12/12/19   Consult Status Consult Status: Follow-up Follow-up type: In-patient    Danford Bad 12/12/2019, 2:35 PM

## 2019-12-12 NOTE — Lactation Note (Signed)
This note was copied from a baby's chart. Lactation Consultation Note  Patient Name: Nicole Eaton [id number] Date: 12/12/2019 Reason for consult: Follow-up assessment;Early term 37-38.6wks;Infant < 6lbs;Other (Comment) (watching BS)  Lactation back in room to set-up DEBP for additional stimulation and milk removal due to baby's size for gestational age and watching of BS levels. Size 68mm flanges fit appropriately, mom educated on pump set-up, use, cleaning, and giving of any expressed milk to baby.  LC educated on changes in suction as appropriate for mom, and encouraged to pump post breastfeeding times.  Mom verbalizes understanding.  Maternal Data Formula Feeding for Exclusion: No Has patient been taught Hand Expression?: Yes Does the patient have breastfeeding experience prior to this delivery?: Yes  Feeding Feeding Type: Breast Fed  LATCH Score Latch: Repeated attempts needed to sustain latch, nipple held in mouth throughout feeding, stimulation needed to elicit sucking reflex.  Audible Swallowing: A few with stimulation (with compression)  Type of Nipple: Everted at rest and after stimulation (required nipple rolling)  Comfort (Breast/Nipple): Soft / non-tender  Hold (Positioning): Assistance needed to correctly position infant at breast and maintain latch.  LATCH Score: 7  Interventions Interventions: Breast feeding basics reviewed;DEBP;Expressed milk  Lactation Tools Discussed/Used Tools: Pump Breast pump type: Double-Electric Breast Pump Pump Review: Setup, frequency, and cleaning;Milk Storage Initiated by:: Magnus Ivan, MPH, IBCLC Date initiated:: 12/12/19   Consult Status Consult Status: Follow-up Date: 12/12/19 Follow-up type: In-patient    Danford Bad 12/12/2019, 12:18 PM

## 2019-12-13 LAB — CBC
HCT: 26.8 % — ABNORMAL LOW (ref 36.0–46.0)
Hemoglobin: 9 g/dL — ABNORMAL LOW (ref 12.0–15.0)
MCH: 27.3 pg (ref 26.0–34.0)
MCHC: 33.6 g/dL (ref 30.0–36.0)
MCV: 81.2 fL (ref 80.0–100.0)
Platelets: 200 10*3/uL (ref 150–400)
RBC: 3.3 MIL/uL — ABNORMAL LOW (ref 3.87–5.11)
RDW: 15.7 % — ABNORMAL HIGH (ref 11.5–15.5)
WBC: 12 10*3/uL — ABNORMAL HIGH (ref 4.0–10.5)
nRBC: 0.2 % (ref 0.0–0.2)

## 2019-12-13 MED ORDER — PRENATAL MULTIVITAMIN CH: 1.0000 | ORAL_TABLET | Freq: Every day | ORAL | Status: DC

## 2019-12-13 MED ORDER — IBUPROFEN 600 MG PO TABS: 600.0000 mg | ORAL_TABLET | Freq: Four times a day (QID) | ORAL | 1 refills | Status: DC | PRN

## 2019-12-13 MED ORDER — FERROUS SULFATE 325 (65 FE) MG PO TABS: 325.0000 mg | ORAL_TABLET | Freq: Two times a day (BID) | ORAL | 3 refills | Status: DC

## 2019-12-13 MED ORDER — ACETAMINOPHEN 325 MG PO TABS: 650.0000 mg | ORAL_TABLET | Freq: Four times a day (QID) | ORAL | Status: AC | PRN

## 2019-12-13 NOTE — Discharge Instructions (Signed)
Breast Pumping Tips Breast pumping is a way to get milk out of your breasts. You will then store the milk for your baby to use when you are away from home. There are three ways to pump. You can:  Use your hand to massage and squeeze your breast (hand expression).  Use a hand-held machine to manually pump your milk.  Use an electric machine to pump your milk. In the beginning you may not get much milk. After a few days your breasts should make more. Pumping can help you start making milk after your baby is born. Pumping helps you to keep making milk when you are away from your baby. When should I pump? You can start pumping soon after your baby is born. Follow these tips:  When you are with your baby: ? Pump after you breastfeed. ? Pump from the free breast while you breastfeed.  When you are away from your baby: ? Pump every 2-3 hours for 15 minutes. ? Pump both breasts at the same time if you can.  If your baby drinks formula, pump around the time your baby gets the formula.  If you drank alcohol, wait 2 hours before you pump.  If you are going to have surgery, ask your doctor when you should pump again. How do I get ready to pump? Take steps to relax. Try these things to help your milk come in:  Smell your baby's blanket or clothes.  Look at a picture or video of your baby.  Sit in a quiet, private space.  Massage your breast and nipple.  Place a cloth on your breast. The cloth should be warm and a little wet.  Play relaxing music.  Picture your milk flowing. What are some tips? General tips for pumping breast milk   Always wash your hands before pumping.  If you do not get much milk or if pumping hurts, try different pump settings or a different kind of pump.  Drink enough fluid so your pee (urine) is clear or pale yellow.  Wear clothing that opens in the front or is easy to take off.  Pump milk into a clean bottle or container.  Do not use anything that has  nicotine or tobacco. Examples are cigarettes and e-cigarettes. If you need help quitting, ask your doctor. Tips for storing breast milk   Store breast milk in a clean, BPA-free container. These include: ? A glass or plastic bottle. ? A milk storage bag.  Store only 2-4 ounces of breast milk in each container.  Swirl the breast milk in the container. Do not shake it.  Write down the date you pumped the milk on the container.  This is how long you can store breast milk: ? Room temperature: 6-8 hours. It is best to use the milk within 4 hours. ? Cooler with ice packs: 24 hours. ? Refrigerator: 5-8 days, if the milk is clean. It is best to use the milk within 3 days. ? Freezer: 9-12 months, if the milk is clean and stored away from the freezer door. It is best to use the milk within 6 months.  Put milk in the back of the refrigerator or freezer.  Thaw frozen milk using warm water. Do not use the microwave. Tips for choosing a breast pump When choosing a pump, keep the following things in mind:  Manual breast pumps do not need electricity. They cost less. They can be hard to use.  Electric breast pumps use electricity. They  are more expensive. They are easier to use. They collect more milk.  The suction cup (flange) should be the right size.  Before you buy the pump, check if your insurance will pay for it. Tips for caring for a breast pump  Check the manual that came with your pump for cleaning tips.  Clean the pump after you use it. To do this: ? Wipe down the electrical part. Use a dry cloth or paper towel. Do not put this part in water or in cleaning products. ? Wash the plastic parts with soap and warm water. Or use the dishwasher if the manual says it is safe. You do not need to clean the tubing unless it touched breast milk. ? Let all the parts air dry. Avoid drying them with a cloth or towel. ? When the parts are clean and dry, put the pump back together. Then store the  pump.  If there is water in the tubing when you want to pump: 1. Attach the tubing to the pump. 2. Turn on the pump. 3. Turn off the pump when the tube is dry.  Try not to touch the inside of pump parts. Summary  Pumping can help you start making milk after your baby is born. It lets you keep making milk when you are away from your baby.  When you are away from your baby, pump for about 15 minutes every 2-3 hours. Pump both breasts at the same time, if you can. This information is not intended to replace advice given to you by your health care provider. Make sure you discuss any questions you have with your health care provider. Document Revised: 07/18/2018 Document Reviewed: 05/02/2016 Elsevier Patient Education  2020 [employer]. Breastfeeding  Choosing to breastfeed is one of the best decisions you can make for yourself and your baby. A change in hormones during pregnancy causes your breasts to make breast milk in your milk-producing glands. Hormones prevent breast milk from being released before your baby is born. They also prompt milk flow after birth. Once breastfeeding has begun, thoughts of your baby, as well as his or her sucking or crying, can stimulate the release of milk from your milk-producing glands. Benefits of breastfeeding Research shows that breastfeeding offers many health benefits for infants and mothers. It also offers a cost-free and convenient way to feed your baby. For your baby  Your first milk (colostrum) helps your baby's digestive system to function better.  Special cells in your milk (antibodies) help your baby to fight off infections.  Breastfed babies are less likely to develop asthma, allergies, obesity, or type 2 diabetes. They are also at lower risk for sudden infant death syndrome (SIDS).  Nutrients in breast milk are better able to meet your baby's needs compared to infant formula.  Breast milk improves your baby's brain development. For  you  Breastfeeding helps to create a very special bond between you and your baby.  Breastfeeding is convenient. Breast milk costs nothing and is always available at the correct temperature.  Breastfeeding helps to burn calories. It helps you to lose the weight that you gained during pregnancy.  Breastfeeding makes your uterus return faster to its size before pregnancy. It also slows bleeding (lochia) after you give birth.  Breastfeeding helps to lower your risk of developing type 2 diabetes, osteoporosis, rheumatoid arthritis, cardiovascular disease, and breast, ovarian, uterine, and endometrial cancer later in life. Breastfeeding basics Starting breastfeeding  Find a comfortable place to sit or lie  down, with your neck and back well-supported.  Place a pillow or a rolled-up blanket under your baby to bring him or her to the level of your breast (if you are seated). Nursing pillows are specially designed to help support your arms and your baby while you breastfeed.  Make sure that your baby's tummy (abdomen) is facing your abdomen.  Gently massage your breast. With your fingertips, massage from the outer edges of your breast inward toward the nipple. This encourages milk flow. If your milk flows slowly, you may need to continue this action during the feeding.  Support your breast with 4 fingers underneath and your thumb above your nipple (make the letter "C" with your hand). Make sure your fingers are well away from your nipple and your baby's mouth.  Stroke your baby's lips gently with your finger or nipple.  When your baby's mouth is open wide enough, quickly bring your baby to your breast, placing your entire nipple and as much of the areola as possible into your baby's mouth. The areola is the colored area around your nipple. ? More areola should be visible above your baby's upper lip than below the lower lip. ? Your baby's lips should be opened and extended outward (flanged) to  ensure an adequate, comfortable latch. ? Your baby's tongue should be between his or her lower gum and your breast.  Make sure that your baby's mouth is correctly positioned around your nipple (latched). Your baby's lips should create a seal on your breast and be turned out (everted).  It is common for your baby to suck about 2-3 minutes in order to start the flow of breast milk. Latching Teaching your baby how to latch onto your breast properly is very important. An improper latch can cause nipple pain, decreased milk supply, and poor weight gain in your baby. Also, if your baby is not latched onto your nipple properly, he or she may swallow some air during feeding. This can make your baby fussy. Burping your baby when you switch breasts during the feeding can help to get rid of the air. However, teaching your baby to latch on properly is still the best way to prevent fussiness from swallowing air while breastfeeding. Signs that your baby has successfully latched onto your nipple  Silent tugging or silent sucking, without causing you pain. Infant's lips should be extended outward (flanged).  Swallowing heard between every 3-4 sucks once your milk has started to flow (after your let-down milk reflex occurs).  Muscle movement above and in front of his or her ears while sucking. Signs that your baby has not successfully latched onto your nipple  Sucking sounds or smacking sounds from your baby while breastfeeding.  Nipple pain. If you think your baby has not latched on correctly, slip your finger into the corner of your baby's mouth to break the suction and place it between your baby's gums. Attempt to start breastfeeding again. Signs of successful breastfeeding Signs from your baby  Your baby will gradually decrease the number of sucks or will completely stop sucking.  Your baby will fall asleep.  Your baby's body will relax.  Your baby will retain a small amount of milk in his or her  mouth.  Your baby will let go of your breast by himself or herself. Signs from you  Breasts that have increased in firmness, weight, and size 1-3 hours after feeding.  Breasts that are softer immediately after breastfeeding.  Increased milk volume, as well as  a change in milk consistency and color by the fifth day of breastfeeding.  Nipples that are not sore, cracked, or bleeding. Signs that your baby is getting enough milk  Wetting at least 1-2 diapers during the first 24 hours after birth.  Wetting at least 5-6 diapers every 24 hours for the first week after birth. The urine should be clear or pale yellow by the age of 5 days.  Wetting 6-8 diapers every 24 hours as your baby continues to grow and develop.  At least 3 stools in a 24-hour period by the age of 5 days. The stool should be soft and yellow.  At least 3 stools in a 24-hour period by the age of 7 days. The stool should be seedy and yellow.  No loss of weight greater than 10% of birth weight during the first 3 days of life.  Average weight gain of 4-7 oz (113-198 g) per week after the age of 4 days.  Consistent daily weight gain by the age of 5 days, without weight loss after the age of 2 weeks. After a feeding, your baby may spit up a small amount of milk. This is normal. Breastfeeding frequency and duration Frequent feeding will help you make more milk and can prevent sore nipples and extremely full breasts (breast engorgement). Breastfeed when you feel the need to reduce the fullness of your breasts or when your baby shows signs of hunger. This is called "breastfeeding on demand." Signs that your baby is hungry include:  Increased alertness, activity, or restlessness.  Movement of the head from side to side.  Opening of the mouth when the corner of the mouth or cheek is stroked (rooting).  Increased sucking sounds, smacking lips, cooing, sighing, or squeaking.  Hand-to-mouth movements and sucking on fingers or  hands.  Fussing or crying. Avoid introducing a pacifier to your baby in the first 4-6 weeks after your baby is born. After this time, you may choose to use a pacifier. Research has shown that pacifier use during the first year of a baby's life decreases the risk of sudden infant death syndrome (SIDS). Allow your baby to feed on each breast as long as he or she wants. When your baby unlatches or falls asleep while feeding from the first breast, offer the second breast. Because newborns are often sleepy in the first few weeks of life, you may need to awaken your baby to get him or her to feed. Breastfeeding times will vary from baby to baby. However, the following rules can serve as a guide to help you make sure that your baby is properly fed:  Newborns (babies 39 weeks of age or younger) may breastfeed every 1-3 hours.  Newborns should not go without breastfeeding for longer than 3 hours during the day or 5 hours during the night.  You should breastfeed your baby a minimum of 8 times in a 24-hour period. Breast milk pumping     Pumping and storing breast milk allows you to make sure that your baby is exclusively fed your breast milk, even at times when you are unable to breastfeed. This is especially important if you go back to work while you are still breastfeeding, or if you are not able to be present during feedings. Your lactation consultant can help you find a method of pumping that works best for you and give you guidelines about how long it is safe to store breast milk. Caring for your breasts while you breastfeed Nipples can  become dry, cracked, and sore while breastfeeding. The following recommendations can help keep your breasts moisturized and healthy:  Avoid using soap on your nipples.  Wear a supportive bra designed especially for nursing. Avoid wearing underwire-style bras or extremely tight bras (sports bras).  Air-dry your nipples for 3-4 minutes after each feeding.  Use only  cotton bra pads to absorb leaked breast milk. Leaking of breast milk between feedings is normal.  Use lanolin on your nipples after breastfeeding. Lanolin helps to maintain your skin's normal moisture barrier. Pure lanolin is not harmful (not toxic) to your baby. You may also hand express a few drops of breast milk and gently massage that milk into your nipples and allow the milk to air-dry. In the first few weeks after giving birth, some women experience breast engorgement. Engorgement can make your breasts feel heavy, warm, and tender to the touch. Engorgement peaks within 3-5 days after you give birth. The following recommendations can help to ease engorgement:  Completely empty your breasts while breastfeeding or pumping. You may want to start by applying warm, moist heat (in the shower or with warm, water-soaked hand towels) just before feeding or pumping. This increases circulation and helps the milk flow. If your baby does not completely empty your breasts while breastfeeding, pump any extra milk after he or she is finished.  Apply ice packs to your breasts immediately after breastfeeding or pumping, unless this is too uncomfortable for you. To do this: ? Put ice in a plastic bag. ? Place a towel between your skin and the bag. ? Leave the ice on for 20 minutes, 2-3 times a day.  Make sure that your baby is latched on and positioned properly while breastfeeding. If engorgement persists after 48 hours of following these recommendations, contact your health care provider or a Advertising copywriter. Overall health care recommendations while breastfeeding  Eat 3 healthy meals and 3 snacks every day. Well-nourished mothers who are breastfeeding need an additional 450-500 calories a day. You can meet this requirement by increasing the amount of a balanced diet that you eat.  Drink enough water to keep your urine pale yellow or clear.  Rest often, relax, and continue to take your prenatal vitamins  to prevent fatigue, stress, and low vitamin and mineral levels in your body (nutrient deficiencies).  Do not use any products that contain nicotine or tobacco, such as cigarettes and e-cigarettes. Your baby may be harmed by chemicals from cigarettes that pass into breast milk and exposure to secondhand smoke. If you need help quitting, ask your health care provider.  Avoid alcohol.  Do not use illegal drugs or marijuana.  Talk with your health care provider before taking any medicines. These include over-the-counter and prescription medicines as well as vitamins and herbal supplements. Some medicines that may be harmful to your baby can pass through breast milk.  It is possible to become pregnant while breastfeeding. If birth control is desired, ask your health care provider about options that will be safe while breastfeeding your baby. Where to find more information: [employer]: www.llli.org Contact a health care provider if:  You feel like you want to stop breastfeeding or have become frustrated with breastfeeding.  Your nipples are cracked or bleeding.  Your breasts are red, tender, or warm.  You have: ? Painful breasts or nipples. ? A swollen area on either breast. ? A fever or chills. ? Nausea or vomiting. ? Drainage other than breast milk from your nipples.  Your breasts do not become full before feedings by the fifth day after you give birth.  You feel sad and depressed.  Your baby is: ? Too sleepy to eat well. ? Having trouble sleeping. ? More than 78 week old and wetting fewer than 6 diapers in a 24-hour period. ? Not gaining weight by 4 days of age.  Your baby has fewer than 3 stools in a 24-hour period.  Your baby's skin or the white parts of his or her eyes become yellow. Get help right away if:  Your baby is overly tired (lethargic) and does not want to wake up and feed.  Your baby develops an unexplained fever. Summary  Breastfeeding  offers many health benefits for infant and mothers.  Try to breastfeed your infant when he or she shows early signs of hunger.  Gently tickle or stroke your baby's lips with your finger or nipple to allow the baby to open his or her mouth. Bring the baby to your breast. Make sure that much of the areola is in your baby's mouth. Offer one side and burp the baby before you offer the other side.  Talk with your health care provider or lactation consultant if you have questions or you face problems as you breastfeed. This information is not intended to replace advice given to you by your health care provider. Make sure you discuss any questions you have with your health care provider. Document Revised: 06/22/2017 Document Reviewed: 04/29/2016 Elsevier Patient Education  2020 [employer]. Postpartum Baby Blues The postpartum period begins right after the birth of a baby. During this time, there is often a lot of joy and excitement. It is also a time of many changes in the life of the parents. No matter how many times a mother gives birth, each child brings new challenges to the family, including different ways of relating to one another. It is common to have feelings of excitement along with confusing changes in moods, emotions, and thoughts. You may feel happy one minute and sad or stressed the next. These feelings of sadness usually happen in the period right after you have your baby, and they go away within a week or two. This is called the "baby blues." What are the causes? There is no known cause of baby blues. It is likely caused by a combination of factors. However, changes in hormone levels after childbirth are believed to trigger some of the symptoms. Other factors that can play a role in these mood changes include:  Lack of sleep.  Stressful life events, such as poverty, caring for a loved one, or death of a loved one.  Genetics. What are the signs or symptoms? Symptoms of this  condition include:  Brief changes in mood, such as going from extreme happiness to sadness.  Decreased concentration.  Difficulty sleeping.  Crying spells and tearfulness.  Loss of appetite.  Irritability.  Anxiety. If the symptoms of baby blues last for more than 2 weeks or become more severe, you may have postpartum depression. How is this diagnosed? This condition is diagnosed based on an evaluation of your symptoms. There are no medical or lab tests that lead to a diagnosis, but there are various questionnaires that a health care provider may use to identify women with the baby blues or postpartum depression. How is this treated? Treatment is not needed for this condition. The baby blues usually go away on their own in 1-2 weeks. Social support is often all that is  needed. You will be encouraged to get adequate sleep and rest. Follow these instructions at home: Lifestyle      Get as much rest as you can. Take a nap when the baby sleeps.  Exercise regularly as told by your health care provider. Some women find yoga and walking to be helpful.  Eat a balanced and nourishing diet. This includes plenty of fruits and vegetables, whole grains, and lean proteins.  Do little things that you enjoy. Have a cup of tea, take a bubble bath, read your favorite magazine, or listen to your favorite music.  Avoid alcohol.  Ask for help with household chores, cooking, grocery shopping, or running errands. Do not try to do everything yourself. Consider hiring a postpartum doula to help. This is a professional who specializes in providing support to new mothers.  Try not to make any major life changes during pregnancy or right after giving birth. This can add stress. General instructions  Talk to people close to you about how you are feeling. Get support from your partner, family members, friends, or other new moms. You may want to join a support group.  Find ways to cope with stress. This  may include: ? Writing your thoughts and feelings in a journal. ? Spending time outside. ? Spending time with people who make you laugh.  Try to stay positive in how you think. Think about the things you are grateful for.  Take over-the-counter and prescription medicines only as told by your health care provider.  Let your health care provider know if you have any concerns.  Keep all postpartum visits as told by your health care provider. This is important. Contact a health care provider if:  Your baby blues do not go away after 2 weeks. Get help right away if:  You have thoughts of taking your own life (suicidal thoughts).  You think you may harm the baby or other people.  You see or hear things that are not there (hallucinations). Summary  After giving birth, you may feel happy one minute and sad or stressed the next. Feelings of sadness that happen right after the baby is born and go away after a week or two are called the "baby blues."  You can manage the baby blues by getting enough rest, eating a healthy diet, exercising, spending time with supportive people, and finding ways to cope with stress.  If feelings of sadness and stress last longer than 2 weeks or get in the way of caring for your baby, talk to your health care provider. This may mean you have postpartum depression. This information is not intended to replace advice given to you by your health care provider. Make sure you discuss any questions you have with your health care provider. Document Revised: 07/20/2018 Document Reviewed: 05/24/2016 Elsevier Patient Education  2020 [employer]  Vaginal Delivery, Care After Refer to this sheet in the next few weeks. These discharge instructions provide you with information on caring for yourself after delivery. Your caregiver may also give you specific instructions. Your treatment has been planned according to the most current medical practices available, but problems  sometimes occur. Call your caregiver if you have any problems or questions after you go home. HOME CARE INSTRUCTIONS 9. Take over-the-counter or prescription medicines only as directed by your caregiver or pharmacist. 10. Do not drink alcohol, especially if you are breastfeeding or taking medicine to relieve pain. 11. Do not smoke tobacco. 12. Continue to use good perineal care.  Good perineal care includes: 1. Wiping your perineum from back to front 2. Keeping your perineum clean. 3. You can do sitz baths twice a day, to help keep this area clean 13. Do not use tampons, douche or have sex until your caregiver says it is okay. 14. Shower only and avoid sitting in submerged water, aside from sitz baths 15. Wear a well-fitting bra that provides breast support. 16. Eat healthy foods. 17. Drink enough fluids to keep your urine clear or pale yellow. 18. Eat high-fiber foods such as whole grain cereals and breads, brown rice, beans, and fresh fruits and vegetables every day. These foods may help prevent or relieve constipation. 19. Avoid constipation with high fiber foods or medications, such as miralax or metamucil 20. Follow your caregiver's recommendations regarding resumption of activities such as climbing stairs, driving, lifting, exercising, or traveling. 21. Talk to your caregiver about resuming sexual activities. Resumption of sexual activities is dependent upon your risk of infection, your rate of healing, and your comfort and desire to resume sexual activity. 22. Try to have someone help you with your household activities and your newborn for at least a few days after you leave the hospital. 23. Rest as much as possible. Try to rest or take a nap when your newborn is sleeping. 24. Increase your activities gradually. 25. Keep all of your scheduled postpartum appointments. It is very important to keep your scheduled follow-up appointments. At these appointments, your caregiver will be checking  to make sure that you are healing physically and emotionally. SEEK MEDICAL CARE IF:   You are passing large clots from your vagina. Save any clots to show your caregiver.  You have a foul smelling discharge from your vagina.  You have trouble urinating.  You are urinating frequently.  You have pain when you urinate.  You have a change in your bowel movements.  You have increasing redness, pain, or swelling near your vaginal incision (episiotomy) or vaginal tear.  You have pus draining from your episiotomy or vaginal tear.  Your episiotomy or vaginal tear is separating.  You have painful, hard, or reddened breasts.  You have a severe headache.  You have blurred vision or see spots.  You feel sad or depressed.  You have thoughts of hurting yourself or your newborn.  You have questions about your care, the care of your newborn, or medicines.  You are dizzy or light-headed.  You have a rash.  You have nausea or vomiting.  You were breastfeeding and have not had a menstrual period within 12 weeks after you stopped breastfeeding.  You are not breastfeeding and have not had a menstrual period by the 12th week after delivery.  You have a fever. SEEK IMMEDIATE MEDICAL CARE IF:   You have persistent pain.  You have chest pain.  You have shortness of breath.  You faint.  You have leg pain.  You have stomach pain.  Your vaginal bleeding saturates two or more sanitary pads in 1 hour. MAKE SURE YOU:   Understand these instructions.  Will watch your condition.  Will get help right away if you are not doing well or get worse. Document Released: 03/25/2000 Document Revised: 08/12/2013 Document Reviewed: 11/23/2011 [facility] Patient Information 2015 [place], [place]. This information is not intended to replace advice given to you by your health care provider. Make sure you discuss any questions you have with your health care provider.  Sitz Bath A sitz bath is a warm  water bath taken  in the sitting position. The water covers only the hips and butt (buttocks). We recommend using one that fits in the toilet, to help with ease of use and cleanliness. It may be used for either healing or cleaning purposes. Sitz baths are also used to relieve pain, itching, or muscle tightening (spasms). The water may contain medicine. Moist heat will help you heal and relax.  HOME CARE  Take 3 to 4 sitz baths a day. 26. Fill the bathtub half-full with warm water. 27. Sit in the water and open the drain a little. 28. Turn on the warm water to keep the tub half-full. Keep the water running constantly. 29. Soak in the water for 15 to 20 minutes. 30. After the sitz bath, pat the affected area dry. GET HELP RIGHT AWAY IF: You get worse instead of better. Stop the sitz baths if you get worse. MAKE SURE YOU:  Understand these instructions.  Will watch your condition.  Will get help right away if you are not doing well or get worse. Document Released: 05/05/2004 Document Revised: 12/21/2011 Document Reviewed: 07/26/2010 [facility] Patient Information 2015 [place]. This information is not intended to replace advice given to you by your health care provider. Make sure you discuss any questions you have with your health care provider.

## 2019-12-13 NOTE — Progress Notes (Signed)
Patient discharged with infant. Discharge instructions, prescriptions, and follow up appointments given to and reviewed with patient. Patient verbalized understanding. Escorted out by axillary.  

## 2019-12-15 LAB — TYPE AND SCREEN
ABO/RH(D): A POS
Antibody Screen: NEGATIVE

## 2021-08-09 ENCOUNTER — Ambulatory Visit (LOCAL_COMMUNITY_HEALTH_CENTER): Payer: Self-pay | Admitting: Advanced Practice Midwife

## 2021-08-09 ENCOUNTER — Encounter: Payer: Self-pay | Admitting: Advanced Practice Midwife

## 2021-08-09 VITALS — BP 123/81 | HR 90 | Temp 98.0°F | Ht 62.0 in | Wt 168.6 lb

## 2021-08-09 DIAGNOSIS — E669 Obesity, unspecified: Secondary | ICD-10-CM | POA: Insufficient documentation

## 2021-08-09 DIAGNOSIS — Z3202 Encounter for pregnancy test, result negative: Secondary | ICD-10-CM

## 2021-08-09 DIAGNOSIS — O169 Unspecified maternal hypertension, unspecified trimester: Secondary | ICD-10-CM

## 2021-08-09 DIAGNOSIS — Z30011 Encounter for initial prescription of contraceptive pills: Secondary | ICD-10-CM

## 2021-08-09 DIAGNOSIS — Z3009 Encounter for other general counseling and advice on contraception: Secondary | ICD-10-CM

## 2021-08-09 HISTORY — DX: Unspecified maternal hypertension, unspecified trimester: O16.9

## 2021-08-09 LAB — WET PREP FOR TRICH, YEAST, CLUE
Trichomonas Exam: NEGATIVE
Yeast Exam: NEGATIVE

## 2021-08-09 LAB — PREGNANCY, URINE: Preg Test, Ur: NEGATIVE

## 2021-08-09 MED ORDER — NORETHINDRONE 0.35 MG PO TABS: 1.0000 | ORAL_TABLET | Freq: Every day | ORAL | 13 refills | Status: DC

## 2021-08-09 NOTE — Progress Notes (Signed)
[facility] DEPARTMENT ?Family Planning Clinic ?[address] [employer] ?Main Number: [phone] ? ? ? ?Family Planning Visit- Initial Visit ? ?Subjective:  ?Nicole Eaton is a 26 y.o. SHF nonsmoker  G2P1102 (8, 26 yo) not breastfeeding being seen today for an initial annual visit and to discuss reproductive life planning.  The patient is currently using No Method - Other Reason for pregnancy prevention. Patient reports   does not want a pregnancy in the next year.   ? ? report they are looking for a method that provides High efficacy at preventing pregnancy ? ?Patient has the following medical conditions has Abdominal pain during pregnancy, third trimester and Obesity BMI=30.8 on their problem list. ? ?Chief Complaint  ?Patient presents with  ? Gynecologic Exam  ? Contraception  ? ? ?Patient reports here for physical, pap, birth control pills. LMP 07/30/21. Last sex yesterday without condom; with current partner x 9 years; 1 partner in last 3 mo. C/o h/a, stuffy nose, sneezing. Working 40 hrs/wk and living with her 2 kids, her sister and sister's partner and 2 kids.  Last ocp taken 05/2021. Last ETOH 10 years ago. Doesn't want pregnancy and refuses ECP. Last dental exam age 31. Last pap 04/23/2018 neg. Last PE 01/22/20 pp with Dr. Feliberto Gottron who put her on Procardia, HCTZ, and Labetalol pp); not taking any meds currently ? ?Patient denies cigs, vaping, cigars, MJ  ? ?Body mass index is 30.84 kg/m?. - Patient is eligible for diabetes screening based on BMI and age >58?  not applicable ?HA1C ordered? not applicable ? ?Patient reports 1  partner/s in last year. Desires STI screening?  No - declines bloodwork ? ?Has patient been screened once for HCV in the past?  No ? No results found for: HCVAB ? ?Does the patient have current drug use (including MJ), have a partner with drug use, and/or has been incarcerated since last result? No  ?If yes-- Screen for HCV through [facility] ?  ?Does  the patient meet criteria for HBV testing? No ? ?Criteria:  ?-Household, sexual or needle sharing contact with HBV ?-History of drug use ?-HIV positive ?-Those with known Hep C ? ? ?Health Maintenance Due  ?Topic Date Due  ? COVID-19 Vaccine (1) Never done  ? Hepatitis C Screening  Never done  ? TETANUS/TDAP  Never done  ? PAP-Cervical Cytology Screening  04/23/2021  ? PAP SMEAR-Modifier  04/23/2021  ? ? ?Review of Systems  ?Neurological:  Positive for headaches (daily always right parietal, ASA resolves, -N&V,-audio, +vision scotoma).  ?All other systems reviewed and are negative. ? ?The following portions of the patient's history were reviewed and updated as appropriate: allergies, current medications, past family history, past medical history, past social history, past surgical history and problem list. Problem list updated. ? ? ?See flowsheet for other program required questions. ? ?Objective:  ? ?Vitals:  ? 08/09/21 1439  ?BP: 123/81  ?Pulse: 90  ?Temp: 98 ?F (36.7 ?C)  ?Weight: 168 lb 9.6 oz (76.5 kg)  ?Height: 5\' 2"  (1.575 m)  ? ? ?Physical Exam ?Constitutional:   ?   Appearance: Normal appearance. She is obese.  ?HENT:  ?   Head: Normocephalic and atraumatic.  ?   Mouth/Throat:  ?   Mouth: Mucous membranes are moist.  ?   Comments: Last dental exam age 70 ?Eyes:  ?   Conjunctiva/sclera: Conjunctivae normal.  ?Neck:  ?   Thyroid: No thyroid mass, thyromegaly or thyroid tenderness.  ?Cardiovascular:  ?  Rate and Rhythm: Normal rate and regular rhythm.  ?Pulmonary:  ?   Effort: Pulmonary effort is normal.  ?   Breath sounds: Normal breath sounds.  ?Chest:  ?Breasts: ?   Right: Normal.  ?   Left: Normal.  ?Abdominal:  ?   Palpations: Abdomen is soft.  ?   Comments: Soft without masses or tenderness, poor tone, increased adipose  ?Genitourinary: ?   General: Normal vulva.  ?   Exam position: Lithotomy position.  ?   Vagina: Vaginal discharge (small amt white creamy leukorrhea, ph<4.5) present.  ?   Cervix:  Normal.  ?   Uterus: Normal.   ?   Adnexa: Right adnexa normal and left adnexa normal.  ?   Rectum: Normal.  ?   Comments: Pap done--cx deviated to left lower ?Musculoskeletal:     ?   General: Normal range of motion.  ?   Cervical back: Normal range of motion and neck supple.  ?Skin: ?   General: Skin is warm and dry.  ?Neurological:  ?   Mental Status: She is alert.  ?Psychiatric:     ?   Mood and Affect: Mood normal.  ? ? ? ? ?Assessment and Plan:  ?Nicole Eaton is a 26 y.o. female presenting to the [facility] Department for an initial annual wellness/contraceptive visit ? ?Contraception counseling: Reviewed options based on patient desire and reproductive life plan. Patient is interested in Oral Contraceptive. This was provided to the patient today.  if not why not clearly documented ? ?Risks, benefits, and typical effectiveness rates were reviewed.  Questions were answered.  Written information was also given to the patient to review.   ? ?The patient will follow up in  1 years for surveillance.  The patient was told to call with any further questions, or with any concerns about this method of contraception.  Emphasized use of condoms 100% of the time for STI prevention. ? ?Need for ECP was assessed. Patient reported Unprotected sex within past 72 hours.  Reviewed options and patient desired No method of ECP, declined all   ? ?1. Family planning ?PT neg today ?Treat wet mount per standing orders ?Immunization nurse consult ?Encouraged to do covid test today ?Please give dental list to pt ?- WET PREP FOR TRICH, YEAST, CLUE ?- Pregnancy, urine ?- Chlamydia/Gonorrhea [facility] ?- IGP, rfx Aptima HPV ASCU ?- norethindrone (MICRONOR) 0.35 MG tablet; Take 1 tablet (0.35 mg total) by mouth daily.  Dispense: 28 tablet; Refill: 13 ? ?2. Encounter for initial prescription of contraceptive pills ?Needs Micronor #13 I po daily to begin today ?Counseled to abstain next 7 days ?Counseled to do  PT 08/25/21 and call if + ? ?3. Obesity, unspecified classification, unspecified obesity type, unspecified whether serious comorbidity present ? ? ? ? ? ?No follow-ups on file. ? ?No future appointments. ? ?Alberteen Spindle, CNM ? ?

## 2021-08-09 NOTE — Progress Notes (Signed)
Patient here today for birth control pills and Physical Exam. Unprotected sex yesterday.Delynn Flavin RN ?

## 2021-08-09 NOTE — Progress Notes (Signed)
Urine PT negative, Wet prep reviewed, no treatment indicated. OC consent signed. 13 packs OC given per provider orders.Burt Knack, RN  ?

## 2021-08-14 LAB — IGP, RFX APTIMA HPV ASCU: PAP Smear Comment: 0

## 2021-09-24 IMAGING — US US OB COMP LESS 14 WK
1 series · 14 of 28 positions shown · non-contrast
Comparison: None

CLINICAL DATA: Multigravida, dating and viability, unable to get
fetal heart tone on Doppler in office; LMP 03/19/2019

EXAM:
OBSTETRIC <14 WK ULTRASOUND
TECHNIQUE: Transabdominal ultrasound was performed for evaluation of the
gestation as well as the maternal uterus and adnexal regions.

[Series 1: us ob comp less 14 wk · 14 of 46 slices shown]
[im 2/46]
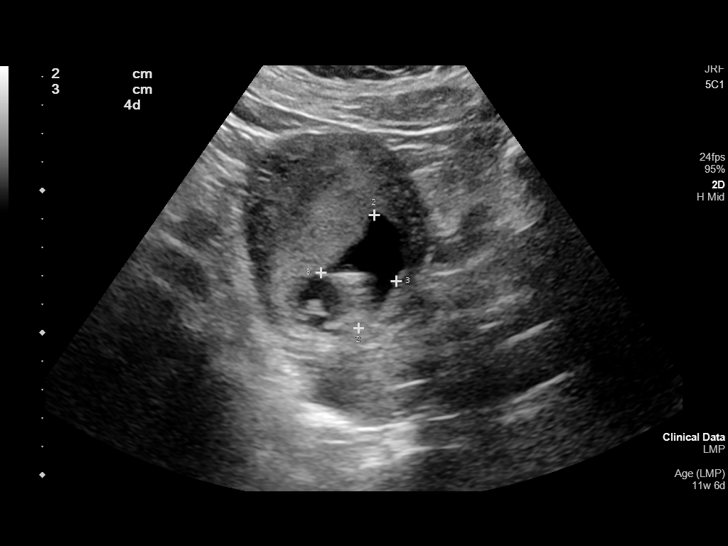
[im 6/46]
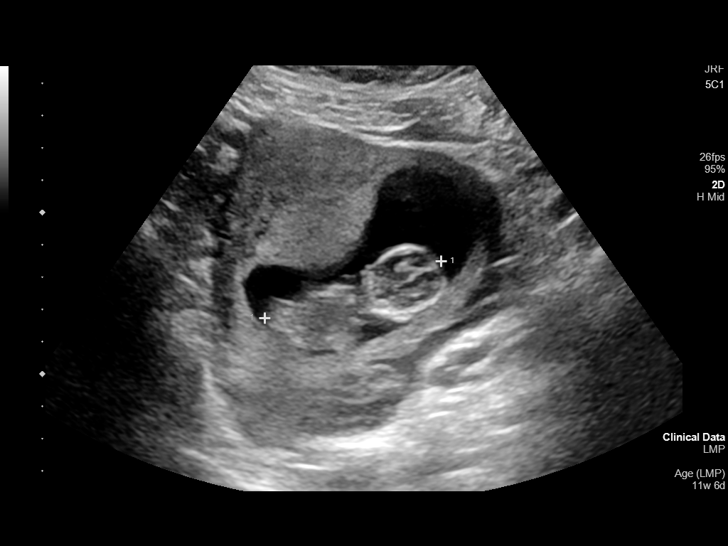
[im 9/46]
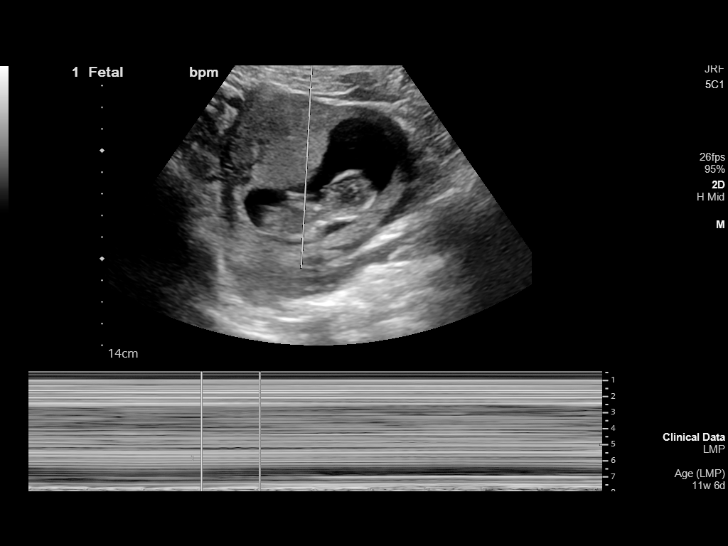
[im 12/46]
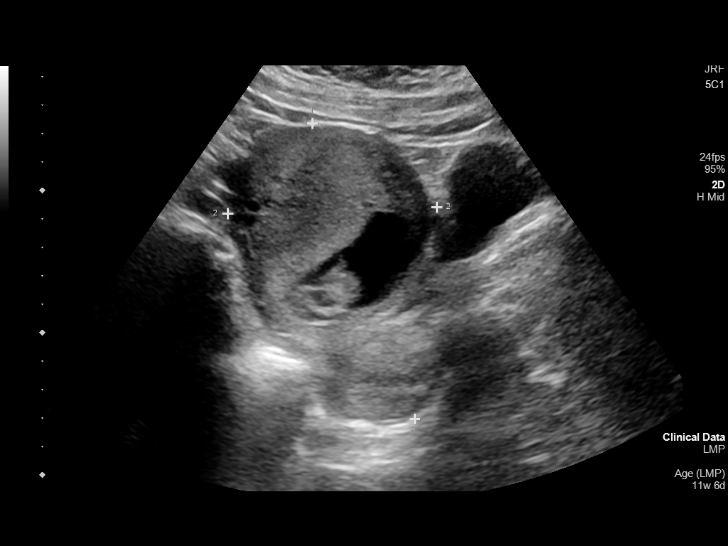
[im 16/46]
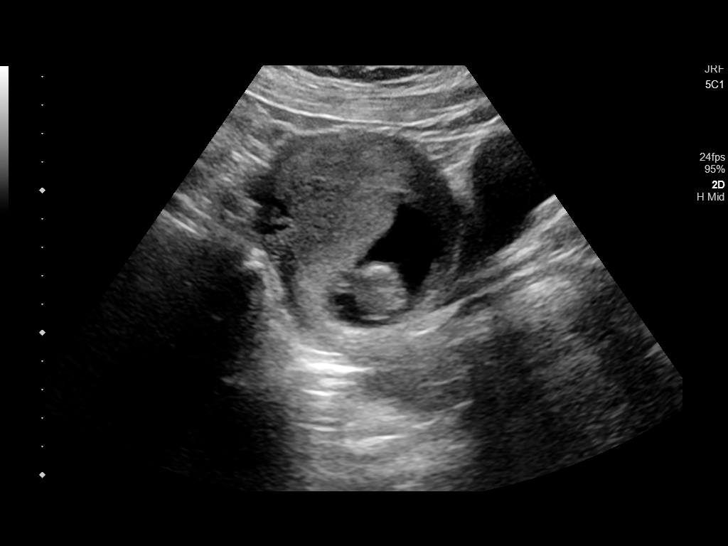
[im 19/46]
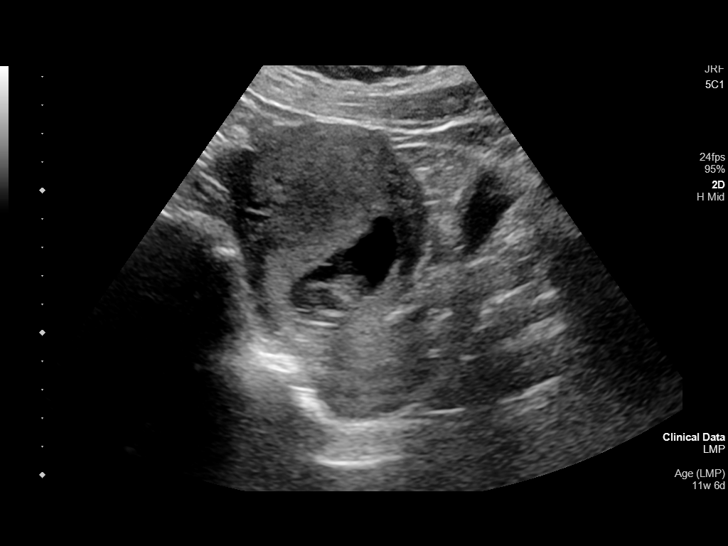
[im 22/46]
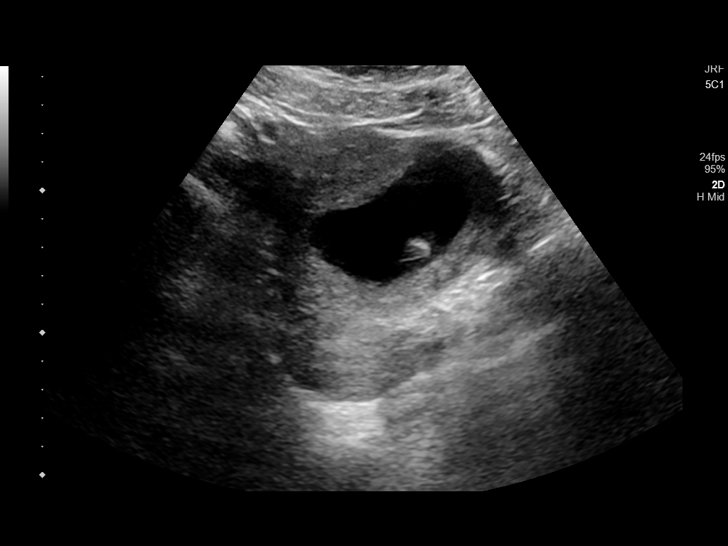
[im 26/46]
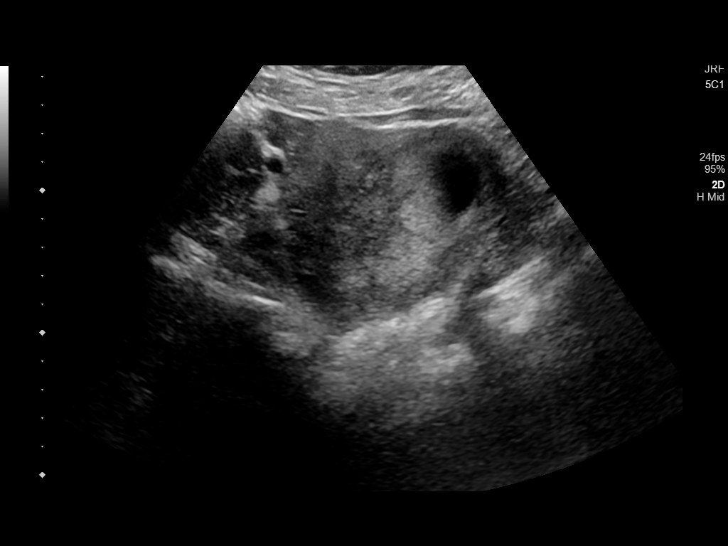
[im 29/46]
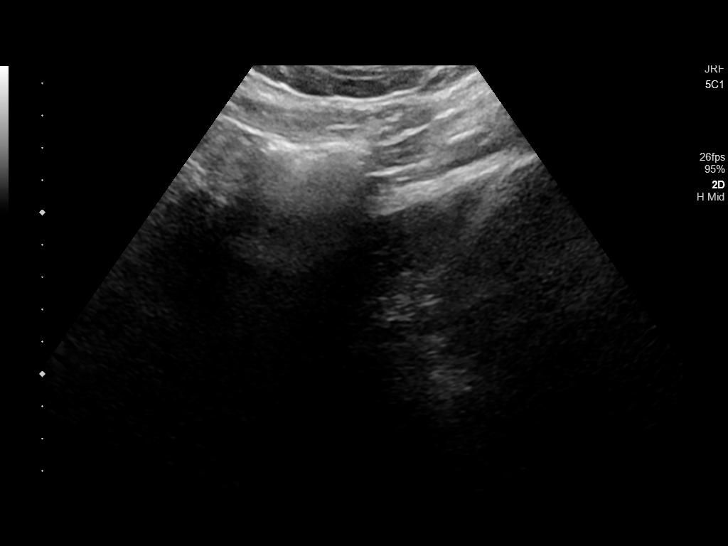
[im 32/46]
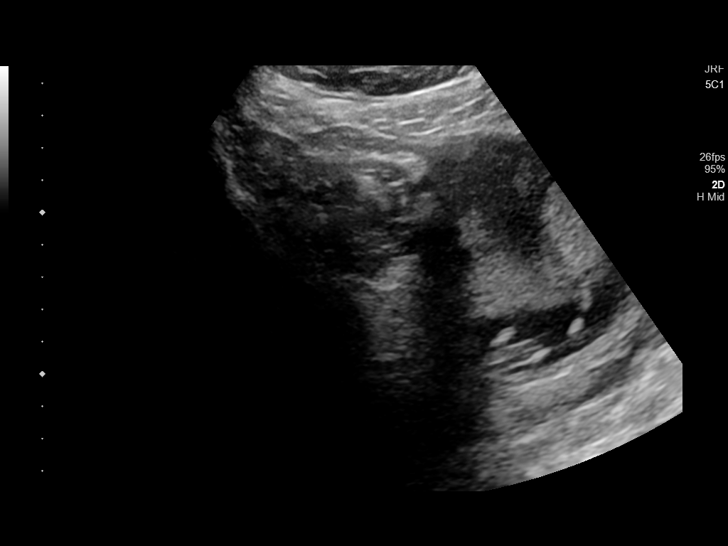
[im 36/46]
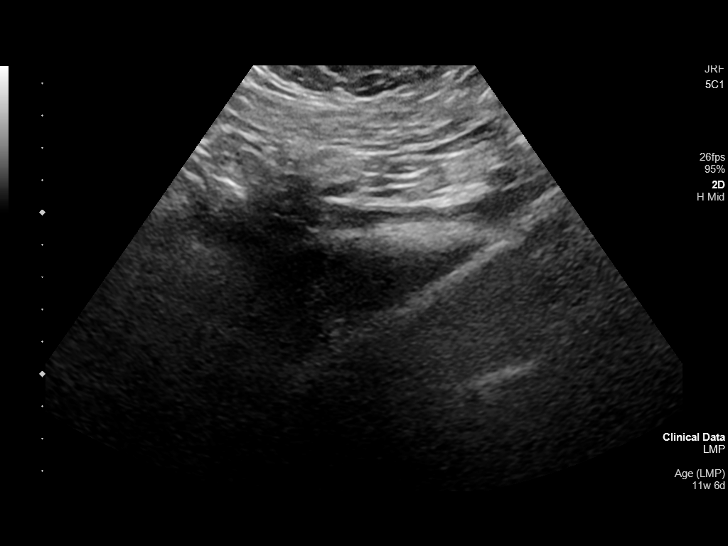
[im 39/46]
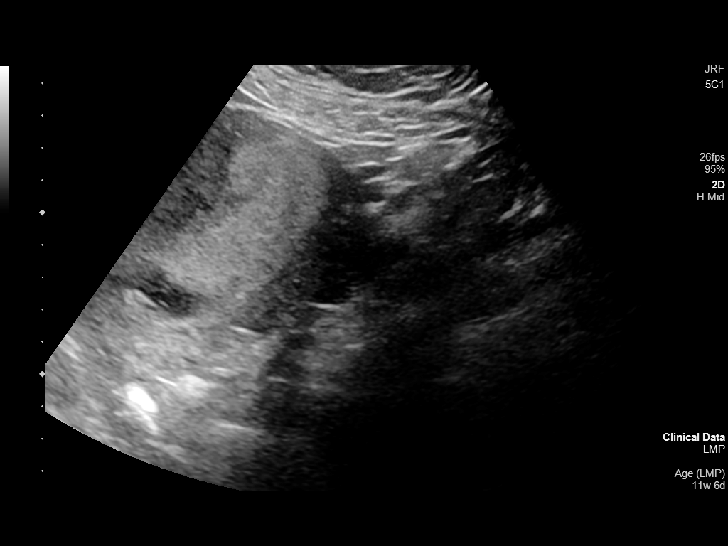
[im 42/46]
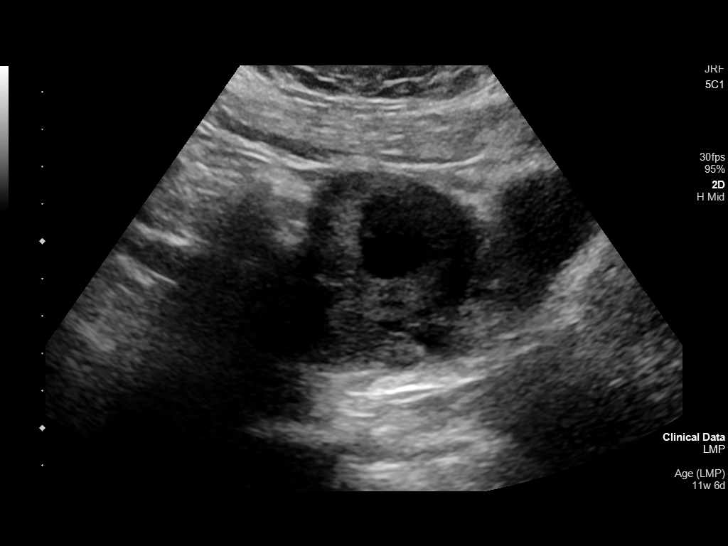
[im 46/46]
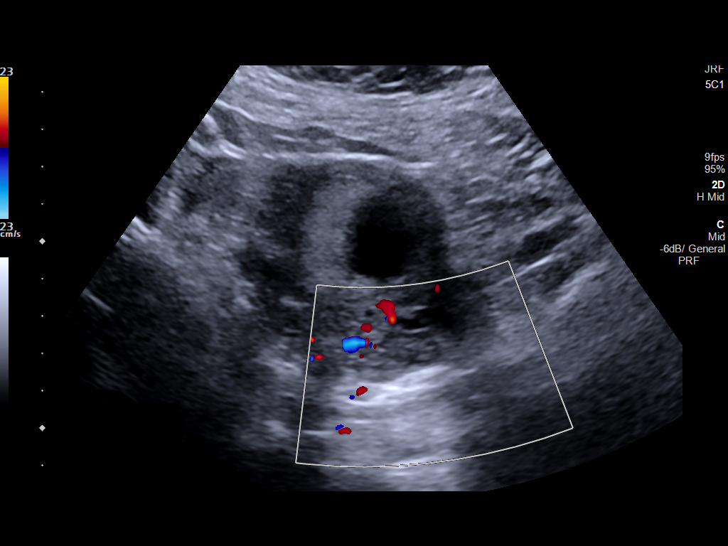

[14 of 28 positions shown; findings below may reference images not displayed]

FINDINGS: Intrauterine gestational sac: Present, single

Yolk sac:  Not visualized

Embryo:  Present

Cardiac Activity: Present

Heart Rate: 175 bpm

CRL:   57 mm   12 w 2 d                  US EDC: 12/21/2019

Subchorionic hemorrhage:  None

Maternal uterus/adnexae:

RIGHT ovary obscured by bowel.

LEFT ovary normal size and morphology 2.3 x 1.8 x 1.7 cm.

No free pelvic fluid or adnexal masses.
IMPRESSION: Single live intrauterine gestation at 12 weeks 2 days EGA by
crown-rump length.

No acute abnormalities.

## 2022-04-15 ENCOUNTER — Telehealth: Payer: Self-pay | Admitting: Family Medicine

## 2022-04-15 NOTE — Telephone Encounter (Signed)
Pt is on BC-pills and she is having irregularities with her period (2times a month). She noticed for the past 3 moths and she is concerned about the color as well. Please call her back asap. Thanks

## 2022-04-22 ENCOUNTER — Ambulatory Visit (LOCAL_COMMUNITY_HEALTH_CENTER): Payer: Self-pay | Admitting: Family

## 2022-04-22 VITALS — BP 121/81 | HR 78 | Wt 174.8 lb

## 2022-04-22 DIAGNOSIS — Z3009 Encounter for other general counseling and advice on contraception: Secondary | ICD-10-CM

## 2022-04-22 DIAGNOSIS — Z3041 Encounter for surveillance of contraceptive pills: Secondary | ICD-10-CM

## 2022-04-22 LAB — WET PREP FOR TRICH, YEAST, CLUE
Trichomonas Exam: NEGATIVE
Yeast Exam: NEGATIVE

## 2022-04-22 NOTE — Progress Notes (Signed)
Pt c/o excessive bleeding while taking Micronor.  Wet mount results reviewed, no treatment required.  Windle Guard, RN

## 2022-05-08 ENCOUNTER — Encounter: Payer: Self-pay | Admitting: Family

## 2022-05-08 NOTE — Progress Notes (Signed)
   [place] problem visit  [place] Department  Subjective:  Nicole Eaton is a 27 y.o. being seen today for increased vaginal bleeding over the past month. Currently taking Micronor for birth control, unable to take COCs due to increased BP while taking which resulted in chronic headaches. She "just wants to make sure this bleeding is normal on these pills".  Chief Complaint  Patient presents with   Contraception    Excessive bleeding with Birth control pills    HPI  See Subjective  Does the patient have a current or past history of drug use? No   No components found for: "HCV"]   Health Maintenance Due  Topic Date Due   COVID-19 Vaccine (1) Never done   Hepatitis C Screening  Never done   DTaP/Tdap/Td (1 - Tdap) Never done   PAP-Cervical Cytology Screening  04/23/2021   INFLUENZA VACCINE  Never done    Review of Systems  All other systems reviewed and are negative.   The following portions of the patient's history were reviewed and updated as appropriate: allergies, current medications, past family history, past medical history, past social history, past surgical history and problem list. Problem list updated.   See flowsheet for other program required questions.  Objective:   Vitals:   04/22/22 1505  BP: 121/81  Pulse: 78  Weight: 174 lb 12.8 oz (79.3 kg)    Physical Exam Cardiovascular:     Rate and Rhythm: Normal rate and regular rhythm.     Pulses: Normal pulses.     Heart sounds: Normal heart sounds.  Genitourinary:    General: Normal vulva.     Exam position: Lithotomy position.     Pubic Area: No rash.      Labia:        Right: No rash or tenderness.        Left: No rash or tenderness.      Vagina: Bleeding present. No vaginal discharge or tenderness.     Cervix: Cervical bleeding present. No cervical motion tenderness or discharge.     Uterus: Normal. Not enlarged and not tender.      Adnexa: Right  adnexa normal and left adnexa normal.       Right: No tenderness.         Left: No tenderness.       Rectum: Normal.  Lymphadenopathy:     Lower Body: No right inguinal adenopathy. No left inguinal adenopathy.  Neurological:     Mental Status: She is alert and oriented to person, place, and time.    Assessment and Plan:  Nicole Eaton is a 27 y.o. female presenting to the [facility] Department for a Women's Health problem visit  1. Family planning Wet prep negative, pending NAAT Currently spotting, no S/S of infection today  - Chlamydia/Gonorrhea [facility] - WET PREP FOR [place], YEAST, CLUE  2. Encounter for surveillance of contraceptive pills Continue Micronor as prescribed Discussed abnormal bleeding pattern common with POPs. Patient prefers to stay on Micronor rather than changing to another method.  Return if symptoms worsen or fail to improve.  No future appointments.  Marline Backbone, FNP

## 2022-07-20 ENCOUNTER — Ambulatory Visit (LOCAL_COMMUNITY_HEALTH_CENTER): Payer: Self-pay

## 2022-07-20 VITALS — BP 119/84 | Ht 62.0 in | Wt 173.0 lb

## 2022-07-20 DIAGNOSIS — Z3009 Encounter for other general counseling and advice on contraception: Secondary | ICD-10-CM

## 2022-07-20 DIAGNOSIS — Z3041 Encounter for surveillance of contraceptive pills: Secondary | ICD-10-CM

## 2022-07-20 MED ORDER — NORETHINDRONE 0.35 MG PO TABS: 1.0000 | ORAL_TABLET | Freq: Every day | ORAL | 0 refills | Status: DC

## 2022-07-20 NOTE — Progress Notes (Signed)
In nurse clinic for ocp (Micronor) refill.   Patient states she has 2 1/2 weeks remaining in last pack of ocp. Reports she is taking ocp daily at same time. Denies missing any pills. Admits to taking pills as prescribed and denies taking ocp as continuous dosing. Pt states she does not have any remaining ocp after this pack and denies misplacing or "losing" ocp.   On 08/09/21, pt was dispensed Micronor #13 packs per Hazle Coca, CNM.   Annual PE due 08/11/2022.   Pt states LMP 06/23/22. Then on 07/07/22 had spotting x 1 day and severe headache with light sensitivity. States hx of headaches.   Consult E Sciora, CNM who explains that headaches would not be from Micronor as this ocp does not contain estrogen. Micronor is considered safe option. Provider recommends RN to review instructions on how to correctly take Micronor.  Provider orders Micronor #1 pack to last until annual PE.   Provider recommends patient to f-u with PCP for headaches and if headaches become severe, recommend patient to seek immediate med attn/go to urgent care/ER.   RN carried out provider orders. Patient in agreement with provider recommendations. Questions answered and reports understanding.   The patient was dispensed Micronor #1 pack today per verbal order by Hazle Coca, CNM. Instructions on how to take med reviewed with patient. I provided counseling today regarding the medication. We discussed the medication, the side effects and when to call clinic. Patient given the opportunity to ask questions. Questions answered.    Pt sent to clerk to schedule annual PE. Appt made for 08/17/2022 at 3:30 pm. Jerel Shepherd, RN

## 2022-07-25 NOTE — Progress Notes (Signed)
In nurse clinic for ocp (Micronor) refill.   Patient states she has 2 1/2 weeks remaining in last pack of ocp. Reports she is taking ocp daily at same time. Denies missing any pills. Admits to taking pills as prescribed and denies taking ocp as continuous dosing. Pt states she does not have any remaining ocp after this pack and denies misplacing or "losing" ocp.   On 08/09/21, pt was dispensed Micronor #13 packs per Hazle Coca, CNM.   Annual PE due 08/11/2022.   Pt states LMP 06/23/22. Then on 07/07/22 had spotting x 1 day and severe headache with light sensitivity. States hx of headaches.   Consult E Nyella Eckels, CNM who explains that headaches would not be from Micronor as this ocp does not contain estrogen. Micronor is considered safe option. Provider recommends RN to review instructions on how to correctly take Micronor.  Provider orders Micronor #1 pack to last until annual PE.   Provider recommends patient to f-u with PCP for headaches and if headaches become severe, recommend patient to seek immediate med attn/go to urgent care/ER.   RN carried out provider orders. Patient in agreement with provider recommendations. Questions answered and reports understanding.   The patient was dispensed Micronor #1 pack today per verbal order by Hazle Coca, CNM. Instructions on how to take med reviewed with patient. I provided counseling today regarding the medication. We discussed the medication, the side effects and when to call clinic. Patient given the opportunity to ask questions. Questions answered.    Pt sent to clerk to schedule annual PE. Appt made for 08/17/2022 at 3:30 pm. Paralee Cancel, RN Consulted on the plan of care for this client.  I agree with the documented note and actions taken to provide care for this client.  Hazle Coca, CNM

## 2022-08-17 ENCOUNTER — Ambulatory Visit (LOCAL_COMMUNITY_HEALTH_CENTER): Payer: Self-pay | Admitting: Family Medicine

## 2022-08-17 VITALS — BP 109/70 | Wt 174.2 lb

## 2022-08-17 DIAGNOSIS — Z3009 Encounter for other general counseling and advice on contraception: Secondary | ICD-10-CM

## 2022-08-17 DIAGNOSIS — Z0000 Encounter for general adult medical examination without abnormal findings: Secondary | ICD-10-CM

## 2022-08-17 MED ORDER — NORETHINDRONE 0.35 MG PO TABS: 1.0000 | ORAL_TABLET | Freq: Every day | ORAL | 12 refills | Status: DC

## 2022-08-17 NOTE — Progress Notes (Signed)
[facility] DEPARTMENT [facility] [address] Main Number: [phone]  Family Planning Visit- Repeat Yearly Visit  Subjective:  Elodie Carmel Surrency is a 27 y.o. G2P1102  being seen today for an annual wellness visit and to discuss contraception options.   The patient is currently using Oral Contraceptive for pregnancy prevention. Patient does not want a pregnancy in the next year.    report they are looking for a method that provides Does not want something inserted   Patient has the following medical problems: has Obesity BMI=30.8 and Hypertension affecting pregnancy with Procardia 60 mg pp x 6+ wks and several antihypertensives on their problem list.  Chief Complaint  Patient presents with   Annual Exam    PE/BC-pills    Patient reports to clinic for PE and OCPs   Patient denies concerns about self today   See flowsheet for other program required questions.   Body mass index is 31.86 kg/m. - Patient is eligible for diabetes screening based on BMI> 25 and age >35?  no HA1C ordered? not applicable  Patient reports 1 of partners in last year. Desires STI screening?  No - declined   Has patient been screened once for HCV in the past?  No  No results found for: "HCVAB"  Does the patient have current of drug use, have a partner with drug use, and/or has been incarcerated since last result? No  If yes-- Screen for HCV through [facility]   Does the patient meet criteria for HBV testing? No  Criteria:  -Household, sexual or needle sharing contact with HBV -History of drug use -HIV positive -Those with known Hep C   Health Maintenance Due  Topic Date Due   COVID-19 Vaccine (1) Never done   Hepatitis C Screening  Never done   DTaP/Tdap/Td (1 - Tdap) Never done    Review of Systems  Constitutional:  Negative for weight loss.  Eyes:  Negative for blurred vision.  Respiratory:  Negative for cough and shortness of  breath.   Cardiovascular:  Negative for claudication.  Gastrointestinal:  Negative for nausea.  Genitourinary:  Negative for dysuria and frequency.  Skin:  Negative for rash.  Neurological:  Negative for headaches.  Endo/Heme/Allergies:  Does not bruise/bleed easily.    The following portions of the patient's history were reviewed and updated as appropriate: allergies, current medications, past family history, past medical history, past social history, past surgical history and problem list. Problem list updated.  Objective:   Vitals:   08/17/22 1531  BP: 109/70  Weight: 174 lb 3.2 oz (79 kg)    Physical Exam Constitutional:      Appearance: Normal appearance.  HENT:     Head: Normocephalic and atraumatic.  Pulmonary:     Effort: Pulmonary effort is normal.  Abdominal:     Palpations: Abdomen is soft.  Musculoskeletal:        General: Normal range of motion.  Skin:    General: Skin is warm and dry.  Neurological:     General: No focal deficit present.     Mental Status: She is alert.  Psychiatric:        Mood and Affect: Mood normal.        Behavior: Behavior normal.       Assessment and Plan:  Cathlyn Tniyah Shiplett is a 27 y.o. female G2P1102 presenting to the [facility] Department for an yearly wellness and contraception visit   Contraception counseling:  Reviewed options based on patient desire and reproductive life plan. Patient is interested in Oral Contraceptive. This was provided to the patient today.   Risks, benefits, and typical effectiveness rates were reviewed.  Questions were answered.  Written information was also given to the patient to review.    The patient will follow up in  1 years for surveillance.  The patient was told to call with any further questions, or with any concerns about this method of contraception.  Emphasized use of condoms 100% of the time for STI prevention.  Patient was assessed for need for ECP. Not indicated-  on OCPs   1. Well woman exam (no gynecological exam) -CBE last done 08/2021, not due until 08/2024 -last pap done 08/09/21, next due 08/09/24  -denies concerns about self   2. Family planning -previously seen in Jan due to 2x/month periods- this has since resolved -desires to continue with Micronor  - norethindrone (MICRONOR) 0.35 MG tablet; Take 1 tablet (0.35 mg total) by mouth daily.  Dispense: 28 tablet; Refill: 12     Return in about 1 year (around 08/17/2023) for annual well-woman exam.  No future appointments.  Lenice Llamas, [place]

## 2022-08-17 NOTE — Progress Notes (Signed)
Pt appointment for PE and OCP refill. Seen by FNP Sydnee Levans. Family planning packet given and contents reviewed. Micronor dispensed with instructions.

## 2023-08-28 ENCOUNTER — Ambulatory Visit (LOCAL_COMMUNITY_HEALTH_CENTER): Payer: Self-pay | Admitting: Nurse Practitioner

## 2023-08-28 ENCOUNTER — Encounter: Payer: Self-pay | Admitting: Nurse Practitioner

## 2023-08-28 ENCOUNTER — Telehealth: Payer: Self-pay

## 2023-08-28 VITALS — BP 118/82 | HR 75 | Ht 62.0 in | Wt 168.0 lb

## 2023-08-28 DIAGNOSIS — Z0000 Encounter for general adult medical examination without abnormal findings: Secondary | ICD-10-CM

## 2023-08-28 DIAGNOSIS — Z30011 Encounter for initial prescription of contraceptive pills: Secondary | ICD-10-CM

## 2023-08-28 DIAGNOSIS — Z3009 Encounter for other general counseling and advice on contraception: Secondary | ICD-10-CM

## 2023-08-28 MED ORDER — NORETHINDRONE 0.35 MG PO TABS: 1.0000 | ORAL_TABLET | Freq: Every day | ORAL | 13 refills | Status: AC

## 2023-08-28 NOTE — Progress Notes (Signed)
 Pt is here for family planning visit.  Family planning education card reviewed and given to pt. The patient was dispensed micronor #6 packs today. I provided counseling today regarding the medication. We discussed the medication, the side effects and when to call clinic. Patient given the opportunity to ask questions. Questions answered.  Condoms declined. Caren Channel, RN

## 2023-08-30 NOTE — Telephone Encounter (Signed)
 Letter sent via my chart

## 2023-09-12 ENCOUNTER — Encounter: Payer: Self-pay | Admitting: Nurse Practitioner

## 2023-09-12 NOTE — Progress Notes (Signed)
 [employer] [facility]PART[facility] [address], [address] [place] [address] Main phone: [phone]  Family Planning Visit - Repeat Yearly Visit  Subjective:  Nicole Eaton is a 28 y.o. G2P1102  being seen today for an annual wellness visit and to discuss contraception options. The patient is currently using oral contraceptive for pregnancy prevention. Patient does not want a pregnancy in the next year.   Patient reports they are looking for a method with the following characteristics:  Does not involve insertion  Method they can control starting and stopping  Patient has the following medical problems:  Patient Active Problem List   Diagnosis Date Noted   Obesity BMI=30.8 08/09/2021   Hypertension affecting pregnancy with Procardia 60 mg pp x 6+ wks and several antihypertensives 08/09/2021   No chief complaint on file.   HPI Patient is a pleasant 28 y.o. female who presents to the office today for annual well woman exam and contraceptive refill. She declines STI testing. Patient reports no concerns today.  Patient indicates 1 female partner in the last 2 and 12 months. She reports practicing vagina sex and never uses condoms. Patient indicates no STI history. Patient reports last sex was 1 days ago. She indicates use of progestin-Only pills as contraception method.  Patient indicates LMP was 08/10/2023.    Review of Systems  Constitutional:  Negative for weight loss.  HENT:  Negative for sore throat.   Eyes:  Negative for blurred vision.  Respiratory:  Negative for cough, shortness of breath and wheezing.   Cardiovascular:  Negative for chest pain and claudication.  Gastrointestinal:  Negative for nausea and vomiting.  Genitourinary:  Negative for dysuria and frequency.  Skin:  Negative for rash.  Neurological:  Negative for dizziness, seizures and headaches.  Endo/Heme/Allergies:  Does not bruise/bleed easily.    See  flowsheet for further details and programmatic requirements Hyperlink available at the top of the signed note in blue.  Flow sheet content below:  Pregnancy Intention Screening Does the patient want to become pregnant in the next year?: No Does the patient's partner want to become pregnant in the next year?: No Would the patient like to discuss contraceptive options today?: No Contraception History Past methods of contraception used by patient:: Contraceptive Pill Adverse effects associated with Contraceptive Pill: none Sexual History What age did you start your period?: 10 How often do you have your period?: monthly Date of last sex?: 08/27/23 Has the patient had unprotected sex within the last 5 days?: No Do you have sex with men, women, both men and women?: Men only In the past 2 months how many partners have you had sex with?: 1 In the past 12 months, how many partners have you had sex with?: 1 Is it possible that any of your sex partners in the past 12 months had sex with someone else whild they were still in a sexual relationship with you?: No What ways do you have sex?: Vaginal Do you or your partner use condoms and/or dental dams every time you have vaginal, oral or anal sex?: No Do you douche?: No Date of last HIV test?: 05/01/15 Have you ever had an STD?: No Have any of your partners had an STD?: No Have you or your partner ever shot up drugs?: No Have any of your partners used drugs in the past?: No Have you or your partners exchanged money or drugs for sex?: No Risk Factors for Hep B Household, sexual, or  needle sharing contact of a person infected with Hep B: No Sexual contact with a person who uses drugs not as prescribed?: No Currently or Ever used drugs not as prescribed: No HIV Positive: No PRep Patient: No Men who have sex with men: N/A Have Hepatitis C: No History of Incarceration: No History of Homeslessness?: No Anal sex following anal drug use?: No Risk  Factors for Hep C Currently using drugs not as prescribed: No Sexual partner(s) currently using drugs as not prescribed: No History of drug use: No HIV Positive: No People with a history of incarceration: No People born between the years of 30 and 27: No Counseling All Patients: Use specific methods of contraceoptive and identify adverse effects (R) Education: Make informed decision about family planning, Reduce risk of transmission and protection from STD's and HIV, Understand BMI >25 or >18.5 is a health risk (weight management educational materials to be provided to client requests), Provided preconception counseling, Promoted daily consumption of MVI with folic acid if capable of conceiving., Review immunization history, inform client of recommended vaccines per CDC's ACIP Guidelines and refer to Immunization clinic, Provide GED counseling if indicated by history, How to use the method selected and information on back up method used, How to use the method consistently and correctly, Teach back method completed, Results of physical assessment and labs (if performed), When to return for follow up (planned return schedule), Warning signs for rare but serious adverse events and what to do if they experience a warning sign (including emergency 24 hour number, where to seek emergency service outside of hours of operation), PCP list given to patient Contraception Wrap Up Current Method: Oral Contraceptive End Method: Oral Contraceptive Contraception Counseling Provided: Yes How was the end contraceptive method provided?: Provided on site  Diabetes screening This patient is 28 y.o. with a BMI of Body mass index is 30.73 kg/m.Aaron Aas  Is patient eligible for diabetes screening (age >35 and BMI >25)?  no  Was Hgb A1c ordered? not applicable  STI screening Patient reports 1 of partners in last year.  Does this patient desire STI screening?  No - Personal choice.  Hepatitis C screening Has patient  been screened once for HCV in the past?  No  No results found for: "HCVAB"  Does the patient meet criteria for HCV testing? No  (If yes-- Screen for HCV through [facility]) Criteria:  Since the last HCV result, does the patient have any of the following? - Current drug use - Have a partner with drug use - Has been incarcerated  Hepatitis B screening Does the patient meet criteria for HBV testing? No Criteria:  -Household, sexual or needle sharing contact with HBV -History of drug use -HIV positive -Those with known Hep C  Cervical Cancer Screening  Result Date Procedure Results Follow-ups  08/09/2021 IGP, rfx Aptima HPV ASCU DIAGNOSIS:: Comment Specimen adequacy:: Comment Clinician Provided ICD10: Comment Performed by:: Comment PAP Smear Comment: . Note:: Comment Test Methodology: Comment PAP Reflex: Comment   04/23/2018 HM PAP SMEAR HM Pap smear:  Neg     Health Maintenance Due  Topic Date Due   Hepatitis C Screening  Never done   DTaP/Tdap/Td (1 - Tdap) Never done   COVID-19 Vaccine (1 - 2024-25 season) Never done    The following portions of the patient's history were reviewed and updated as appropriate: allergies, current medications, past family history, past medical history, past social history, past surgical history and problem list. Problem list updated.  Objective:   Vitals:   08/28/23 1527  BP: 118/82  Pulse: 75  Weight: 168 lb (76.2 kg)  Height: 5\' 2"  (1.575 m)    Physical Exam Vitals and nursing note reviewed.  Constitutional:      Appearance: Normal appearance.  HENT:     Head: Normocephalic.     Salivary Glands: Right salivary gland is not diffusely enlarged or tender. Left salivary gland is not diffusely enlarged or tender.     Mouth/Throat:     Lips: Pink. No lesions.     Mouth: Mucous membranes are moist.     Tongue: No lesions. Tongue does not deviate from midline.     Pharynx: Oropharynx is clear. Uvula midline.     Tonsils: No  tonsillar exudate.  Eyes:     General:        Right eye: No discharge.        Left eye: No discharge.     Conjunctiva/sclera:     Right eye: Right conjunctiva is not injected.     Left eye: Left conjunctiva is not injected.  Neck:     Thyroid: No thyroid mass, thyromegaly or thyroid tenderness.     Trachea: Trachea and phonation normal. No tracheal tenderness or tracheal deviation.  Cardiovascular:     Rate and Rhythm: Normal rate and regular rhythm.     Heart sounds: Normal heart sounds, S1 normal and S2 normal.  Pulmonary:     Effort: Pulmonary effort is normal.     Breath sounds: Normal breath sounds and air entry.  Abdominal:     General: Abdomen is flat. Bowel sounds are normal. There is no distension.     Palpations: Abdomen is soft.     Tenderness: There is no abdominal tenderness. There is no guarding or rebound.  Genitourinary:    Comments: Declined genital exam. Declined STI testing. PAP not due today.  Lymphadenopathy:     Head:     Right side of head: No submental, submandibular, tonsillar, preauricular or posterior auricular adenopathy.     Left side of head: No submental, submandibular, tonsillar, preauricular or posterior auricular adenopathy.     Cervical: No cervical adenopathy.     Right cervical: No superficial or posterior cervical adenopathy.    Left cervical: No superficial or posterior cervical adenopathy.     Upper Body:     Right upper body: No supraclavicular or axillary adenopathy.     Left upper body: No supraclavicular or axillary adenopathy.  Skin:    General: Skin is warm and dry.     Findings: No rash.  Neurological:     Mental Status: She is alert and oriented to person, place, and time.  Psychiatric:        Attention and Perception: Attention and perception normal.        Mood and Affect: Mood and affect normal.        Speech: Speech normal.        Behavior: Behavior normal. Behavior is cooperative.        Thought Content: Thought content  normal.     Assessment and Plan:  Nicole Eaton is a 28 y.o. female G2P1102 presenting to the [facility] Department for an yearly wellness and contraception visit  1. Family planning (Primary) Contraception counseling:  Reviewed options based on patient desire and reproductive life plan. Patient is interested in Oral Contraceptive. This was provided to the patient today.   Risks, benefits, and typical effectiveness rates  were reviewed.  Questions were answered.  Written information was also given to the patient to review.    The patient will follow up in  1 years for surveillance.  The patient was told to call with any further questions, or with any concerns about this method of contraception.  Emphasized use of condoms 100% of the time for STI prevention.  Emergency Contraception Precautions (ECP): Patient assessed for need of ECP. She is not a candidate based on OCPs used correctly without missed doses.  - norethindrone  (MICRONOR ) 0.35 MG tablet; Take 1 tablet (0.35 mg total) by mouth daily.  Dispense: 28 tablet; Refill: 13  2. Well woman exam (no gynecological exam) PAP not collected today. Due in 1 year. CBE not completed today. Should be performed 08/2024 unless concerns arise sooner.  Patient has a PCP.  Return in about 1 year (around 08/27/2024) for annual well-woman exam.  No future appointments.  Merleen Stare, NP

## 2024-02-02 ENCOUNTER — Ambulatory Visit (LOCAL_COMMUNITY_HEALTH_CENTER): Payer: Self-pay

## 2024-02-02 VITALS — BP 120/85 | Wt 173.5 lb

## 2024-02-02 DIAGNOSIS — Z3009 Encounter for other general counseling and advice on contraception: Secondary | ICD-10-CM

## 2024-02-02 DIAGNOSIS — Z3041 Encounter for surveillance of contraceptive pills: Secondary | ICD-10-CM

## 2024-02-02 MED ORDER — NORETHINDRONE 0.35 MG PO TABS: 1.0000 | ORAL_TABLET | Freq: Every day | ORAL | 7 refills | Status: AC

## 2024-02-02 NOTE — Progress Notes (Signed)
 In nurse clinic for OCP refills. Order for Micronor  0.35mg  dated 08/28/23 from J.Idol. Spoke with provicer E.Holmes to give remaining 8 packs to complete order. Dispensed 6 in May/19/25).Voices no concerns.  LMP 01/17/24. Not missed any pills. Is on last pack with 2weeks left of pack. Discussed with pt last annual PE 08/28/23 will be due for yearly May 2026.   The patient was dispense Micrnor 0.35mg  #8 packs today. I provided counseling today regarding the medication. We discussed the medication, the side effects and when to call clinic. Patient given the opportunity to ask questions. Questions answered.
